# Patient Record
Sex: Female | Born: 1985 | Race: White | Hispanic: No | Marital: Married | Smoking: Never smoker
Health system: Southern US, Community
[De-identification: ages and names within clinical notes are randomized; demographics above are authoritative.]

## PROBLEM LIST (undated history)

## (undated) DIAGNOSIS — R87629 Unspecified abnormal cytological findings in specimens from vagina: Secondary | ICD-10-CM

## (undated) DIAGNOSIS — F32A Depression, unspecified: Secondary | ICD-10-CM

## (undated) DIAGNOSIS — F419 Anxiety disorder, unspecified: Secondary | ICD-10-CM

## (undated) DIAGNOSIS — E041 Nontoxic single thyroid nodule: Secondary | ICD-10-CM

## (undated) DIAGNOSIS — N289 Disorder of kidney and ureter, unspecified: Secondary | ICD-10-CM

## (undated) DIAGNOSIS — K219 Gastro-esophageal reflux disease without esophagitis: Secondary | ICD-10-CM

## (undated) DIAGNOSIS — Z8619 Personal history of other infectious and parasitic diseases: Secondary | ICD-10-CM

## (undated) DIAGNOSIS — Z9889 Other specified postprocedural states: Secondary | ICD-10-CM

## (undated) DIAGNOSIS — F329 Major depressive disorder, single episode, unspecified: Secondary | ICD-10-CM

## (undated) DIAGNOSIS — K589 Irritable bowel syndrome without diarrhea: Secondary | ICD-10-CM

## (undated) HISTORY — DX: Personal history of other infectious and parasitic diseases: Z86.19

## (undated) HISTORY — PX: WISDOM TOOTH EXTRACTION: SHX21

## (undated) HISTORY — DX: Gastro-esophageal reflux disease without esophagitis: K21.9

## (undated) HISTORY — DX: Nontoxic single thyroid nodule: E04.1

## (undated) HISTORY — DX: Unspecified abnormal cytological findings in specimens from vagina: R87.629

## (undated) HISTORY — DX: Disorder of kidney and ureter, unspecified: N28.9

## (undated) HISTORY — DX: Irritable bowel syndrome, unspecified: K58.9

---

## 2008-12-12 ENCOUNTER — Emergency Department (HOSPITAL_COMMUNITY): Admission: EM | Admit: 2008-12-12 | Discharge: 2008-12-12 | Payer: Self-pay | Admitting: Emergency Medicine

## 2009-07-02 ENCOUNTER — Encounter: Payer: Self-pay | Admitting: Gastroenterology

## 2009-07-06 ENCOUNTER — Emergency Department (HOSPITAL_BASED_OUTPATIENT_CLINIC_OR_DEPARTMENT_OTHER): Admission: EM | Admit: 2009-07-06 | Discharge: 2009-07-06 | Payer: Self-pay | Admitting: Emergency Medicine

## 2009-07-09 ENCOUNTER — Encounter (INDEPENDENT_AMBULATORY_CARE_PROVIDER_SITE_OTHER): Payer: Self-pay | Admitting: *Deleted

## 2009-07-31 ENCOUNTER — Ambulatory Visit: Payer: Self-pay | Admitting: Gastroenterology

## 2009-07-31 DIAGNOSIS — R109 Unspecified abdominal pain: Secondary | ICD-10-CM | POA: Insufficient documentation

## 2009-07-31 LAB — CONVERTED CEMR LAB
ALT: 13 units/L (ref 0–35)
AST: 17 units/L (ref 0–37)
BUN: 13 mg/dL (ref 6–23)
Basophils Absolute: 0 10*3/uL (ref 0.0–0.1)
Basophils Relative: 0.6 % (ref 0.0–3.0)
Creatinine, Ser: 0.7 mg/dL (ref 0.4–1.2)
Eosinophils Absolute: 0.1 10*3/uL (ref 0.0–0.7)
HCT: 35.7 % — ABNORMAL LOW (ref 36.0–46.0)
Hemoglobin: 12.2 g/dL (ref 12.0–15.0)
Lymphs Abs: 2 10*3/uL (ref 0.7–4.0)
MCHC: 34.2 g/dL (ref 30.0–36.0)
MCV: 92 fL (ref 78.0–100.0)
Monocytes Absolute: 0.4 10*3/uL (ref 0.1–1.0)
Neutro Abs: 5.1 10*3/uL (ref 1.4–7.7)
RDW: 11.3 % — ABNORMAL LOW (ref 11.5–14.6)
Tissue Transglutaminase Ab, IgA: 0.4 units (ref <7)
Total Bilirubin: 0.2 mg/dL — ABNORMAL LOW (ref 0.3–1.2)

## 2009-08-13 ENCOUNTER — Ambulatory Visit (HOSPITAL_COMMUNITY): Admission: RE | Admit: 2009-08-13 | Discharge: 2009-08-13 | Payer: Self-pay | Admitting: Gastroenterology

## 2009-08-19 ENCOUNTER — Encounter: Payer: Self-pay | Admitting: Gastroenterology

## 2009-09-01 HISTORY — PX: LAPAROSCOPIC CHOLECYSTECTOMY: SUR755

## 2009-09-07 ENCOUNTER — Ambulatory Visit (HOSPITAL_COMMUNITY): Admission: RE | Admit: 2009-09-07 | Discharge: 2009-09-07 | Payer: Self-pay | Admitting: General Surgery

## 2009-09-23 ENCOUNTER — Encounter: Payer: Self-pay | Admitting: Gastroenterology

## 2010-05-05 ENCOUNTER — Ambulatory Visit (HOSPITAL_COMMUNITY): Admission: RE | Admit: 2010-05-05 | Discharge: 2010-05-05 | Payer: Self-pay | Admitting: Family Medicine

## 2010-05-05 ENCOUNTER — Encounter (INDEPENDENT_AMBULATORY_CARE_PROVIDER_SITE_OTHER): Payer: Self-pay | Admitting: *Deleted

## 2010-06-25 ENCOUNTER — Ambulatory Visit: Payer: Self-pay | Admitting: Gastroenterology

## 2010-06-25 LAB — CONVERTED CEMR LAB: TSH: 1.01 microintl units/mL (ref 0.35–5.50)

## 2010-07-06 ENCOUNTER — Encounter: Payer: Self-pay | Admitting: Gastroenterology

## 2010-07-24 ENCOUNTER — Encounter: Payer: Self-pay | Admitting: Family Medicine

## 2010-08-03 NOTE — Assessment & Plan Note (Signed)
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  who has had stomach issues for her whole life.  Used to have have vomitting after eating especially with pastas, pizzas, heavy meals.    past 6 months, alot nauseas after eating.  burning in chest.    has had two episodes of RUQ pains, after eating heavy meals.  These lasted  about 2 hours, was miserable with very sharp pains.  Not sure if she was nauseas.  Had a gallbladder US a few  weeks ago, no gallstones were noted.    rare NSAIDs.  Does get [pyrosis on occasion, belching. Tums +/- improvement.           Current Medications (verified): 1)  Low-Ogestrel 0.3-30 Mg-Mcg Tabs (Norgestrel-Ethinyl Estradiol) .... 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  All other review of systems was otherwise negative.   Vital Signs:  Patient profile:   25 year old female Height:      65 inches Weight:      140 pounds BMI:     23.38 Pulse rate:   92 / minute Pulse rhythm:   regular BP sitting:   100 / 60  (left arm) Cuff size:   regular  Vitals Entered By: Francee Piccolo CMA Duncan Dull)  (July 31, 2009 2:34 PM)  Physical Exam  Additional Exam:  Constitutional: generally well appearing Psychiatric: alert and oriented times 3 Eyes: extraocular movements intact Mouth: oropharynx moist, no lesions Neck: supple, no lymphadenopathy Cardiovascular: heart regular rate and rythm Lungs: CTA bilaterally Abdomen: soft, non-tender, non-distended, no obvious ascites, no peritoneal signs, normal bowel sounds Extremities: no lower extremity edema bilaterally Skin: no lesions on visible extremities    Impression & Recommendations:  Problem # 1:  GERD-like symptoms, right upper quadrant pain she may have to issues going on. Her intermittent right upper quadrant pains are most similar to biliary disease. She has had an abdominal ultrasound and tells me that it was normal. We will get those results sent over. Perhaps she has biliary dyskinesia. We will arrange for a HIDA scan to be performed. If this is positive then I will likely set her up with general surgical referral. She also has GERD-like issues of belching, acid regurgitation. She is never really tried proton pump inhibitors and so we will give her samples of one today. She will take one pill daily before breakfast meal. We will also get labs sent celiac sprue testing, CBC, complete blood profile  Patient Instructions: 1)  You will get lab test(s) done today (tTG, IgA, cbc, cmet). 2)  We will get copies of your recent ultrasound (done at [facility]).  3)  HIDA scan of gallbladder function with CCK stimiluation. 4)  Samples of PPI, take one pill 20-30 min before breakfast meal. 5)  A copy of this information will be sent to Dr. Edmon Crape. 6)  The medication list was reviewed and reconciled.  All changed / newly prescribed medications were explained.  A complete medication list was provided to the patient / caregiver.  Appended Document: Orders Update    Clinical Lists Changes  Problems: Added new problem of  ABDOMINAL PAIN OTHER SPECIFIED SITE (ICD-789.09) Orders: Added new Test order of TLB-CBC Platelet - w/Differential (85025-CBCD) - Signed Added new Test order of TLB-CMP (Comprehensive Metabolic Pnl) (80053-COMP) - Signed Added new Test order of TLB-IgA (Immunoglobulin A) (82784-IGA) - Signed Added new Test order of T-Tissue Transglutamase Ab IgA ([id number]) - Signed Added new Referral order of HIDA CCK (HIDA CCK) - Signed      Appended Document:  recieved [place] report dated 07/02/2009: "normal abd ultrasound"

## 2010-08-03 NOTE — Letter (Signed)
Summary: New Patient letter  [facility]  [address] [place], [place] [address]   Phone: [phone]  Fax: [phone]       07/09/2009 MRN: [id number]  Misty Austin [address] [place], [place]  [address]  Dear Ms. [facility],  Welcome to the Gastroenterology Division at [facility].    You are scheduled to see Dr. Jarold Motto on 07-31-09 at 9:30a.m. on the 3rd floor at [facility], [address]  We ask that you try to arrive at our office 15 minutes prior to your appointment time to allow for check-in.  We would like you to complete the enclosed self-administered evaluation form prior to your visit and bring it with you on the day of your appointment.  We will review it with you.  Also, please bring a complete list of all your medications or, if you prefer, bring the medication bottles and we will list them.  Please bring your insurance card so that we may make a copy of it.  If your insurance requires a referral to see a specialist, please bring your referral form from your primary care physician.  Co-payments are due at the time of your visit and may be paid by cash, check or credit card.     Your office visit will consist of a consult with your physician (includes a physical exam), any laboratory testing he/she may order, scheduling of any necessary diagnostic testing (e.g. x-ray, ultrasound, CT-scan), and scheduling of a procedure (e.g. Endoscopy, Colonoscopy) if required.  Please allow enough time on your schedule to allow for any/all of these possibilities.    If you cannot keep your appointment, please call [phone] to cancel or reschedule prior to your appointment date.  This allows [place] the opportunity to schedule an appointment for another patient in need of care.  If you do not cancel or reschedule by 5 p.m. the business day prior to your appointment date, you will be charged a $50.00 late cancellation/no-show fee.    Thank you for  choosing [facility] for your medical needs.  We appreciate the opportunity to care for you.  Please visit [place] at our website  to learn more about our practice.                     Sincerely,                                                             The Gastroenterology Division

## 2010-08-03 NOTE — Consult Note (Signed)
Summary: [facility]   Imported By: Sherian Rein 09/09/2009 14:09:59  _____________________________________________________________________  External Attachment:    Type:   Image     Comment:   External Document

## 2010-08-03 NOTE — Letter (Signed)
Summary: [facility]   Imported By: Maryln Gottron 10/20/2009 15:33:35  _____________________________________________________________________  External Attachment:    Type:   Image     Comment:   External Document

## 2010-08-05 NOTE — Miscellaneous (Signed)
Summary: rx  Clinical Lists Changes  Medications: Rx of PRILOSEC OTC 20 MG TBEC (OMEPRAZOLE MAGNESIUM) 1 by mouth once daily;  #30 x 11;  Signed;  Entered by: Rachael Fee MD;  Authorized by: Rachael Fee MD;  Method used: Electronically to [facility]*, [address], [address] Shipping/mailing, [place], [place]  [address], Ph: [phone], Fax: [phone]    Prescriptions: PRILOSEC OTC 20 MG TBEC (OMEPRAZOLE MAGNESIUM) 1 by mouth once daily  #30 x 11   Entered and Authorized by:   Rachael Fee MD   Signed by:   Rachael Fee MD on 07/06/2010   Method used:   Electronically to        [facility]* (retail)       [address]       [address] Shipping/mailing       [place], [place]  [address]       Ph: [phone]       Fax: [phone]   RxID:   [id number]

## 2010-08-05 NOTE — Assessment & Plan Note (Signed)
  Review of gastrointestinal problems: 1. 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 She  had lap chole earlier this year, did well until about a month ago.  Bloating, twisting feeling that lasted for 3-4 days.  She has occasional mucous, no bleeding.  Overall stable weight.  Sometimes BM will help alleviate discomfort.  She tried one of her mom's antispasm med and it helps.  She had a CT and tells me this was completely normal. She has been fairly fatigued recently but has had a lot stress at work.           Current Medications (verified): 1)  Low-Ogestrel 0.3-30 Mg-Mcg Tabs (Norgestrel-Ethinyl Estradiol) .... Take 1 Tablet By Mouth Once A Day 2)  Prilosec Otc 20 Mg Tbec (Omeprazole Magnesium) .Marland Kitchen.. 1 By Mouth Once Daily  Allergies (verified): No Known Drug Allergies  Vital Signs:  Patient profile:   25 year old female Height:      65 inches Weight:      142 pounds BMI:     23.72 BSA:     1.71 Pulse rate:   80 / minute Pulse rhythm:   regular BP sitting:   100 / 70  (left arm)  Vitals Entered By: Merri Ray CMA Duncan Dull) (June 25, 2010 1:48 PM)  Physical Exam  Additional Exam:  Constitutional: generally well appearing Psychiatric: alert and oriented times 3 Abdomen: soft, non-tender, non-distended, normal bowel sounds    Impression & Recommendations:  Problem # 1:  IBS-like symptoms, fatigue we will check a thyroid level today. I recommended she try taking antispasm medicines which have called in for her. She'll take these on an as-needed basis and she knows to get in touch if things worsen or do not  improve.  Other Orders: TLB-TSH (Thyroid Stimulating Hormone) [phone])  Patient Instructions: 1)  Dr. Christella Hartigan will review the CT scan. 2)  For now, trial of sublingual antispasm meds. 3)  Call if symptoms worsen. 4)  You will get lab test(s) done today (tsh). 5)  The medication list was reviewed and reconciled.  All changed / newly prescribed medications were explained.  A complete medication list was provided to the patient / caregiver. Prescriptions: HYOSCYAMINE SULFATE 0.125 MG SUBL (HYOSCYAMINE SULFATE) take one to two pills as needed, every 5-6 hours  #50 x 3   Entered and Authorized by:   Rachael Fee MD   Signed by:   Rachael Fee MD on 06/25/2010   Method used:   Electronically to        [facility]* (retail)       [address]       [address] Shipping/mailing       [place], [place]  [address]       Ph: [phone]       Fax: [phone]   RxID:   [id number]

## 2010-09-19 LAB — COMPREHENSIVE METABOLIC PANEL
ALT: 19 U/L (ref 0–35)
AST: 16 U/L (ref 0–37)
Albumin: 4.2 g/dL (ref 3.5–5.2)
Alkaline Phosphatase: 53 U/L (ref 39–117)
Calcium: 9.3 mg/dL (ref 8.4–10.5)
GFR calc Af Amer: >60 mL/min (ref >60)
Potassium: 3.6 mEq/L (ref 3.5–5.1)
Sodium: 142 mEq/L (ref 135–145)
Total Protein: 7 g/dL (ref 6.0–8.3)

## 2010-09-19 LAB — URINALYSIS, ROUTINE W REFLEX MICROSCOPIC
Bilirubin Urine: NEGATIVE
Nitrite: NEGATIVE
Protein, ur: 30 mg/dL — AB
Specific Gravity, Urine: 1.025 (ref 1.005–1.030)
Urobilinogen, UA: 0.2 mg/dL (ref 0.0–1.0)

## 2010-09-19 LAB — CBC
Hemoglobin: 12.3 g/dL (ref 12.0–15.0)
MCHC: 34.8 g/dL (ref 30.0–36.0)
Platelets: 259 10*3/uL (ref 150–400)
RDW: 11.8 % (ref 11.5–15.5)

## 2010-09-19 LAB — URINE MICROSCOPIC-ADD ON

## 2010-09-19 LAB — DIFFERENTIAL
Basophils Relative: 1 % (ref 0–1)
Eosinophils Absolute: 0.1 10*3/uL (ref 0.0–0.7)
Eosinophils Relative: 2 % (ref 0–5)
Lymphs Abs: 2.1 10*3/uL (ref 0.7–4.0)
Monocytes Absolute: 0.4 10*3/uL (ref 0.1–1.0)
Monocytes Relative: 8 % (ref 3–12)
Neutrophils Relative %: 50 % (ref 43–77)

## 2010-09-19 LAB — PREGNANCY, URINE: Preg Test, Ur: NEGATIVE

## 2010-09-19 LAB — URINE CULTURE: Colony Count: 45000

## 2010-09-27 LAB — PREGNANCY, URINE: Preg Test, Ur: NEGATIVE

## 2011-11-30 ENCOUNTER — Ambulatory Visit (INDEPENDENT_AMBULATORY_CARE_PROVIDER_SITE_OTHER): Payer: 59 | Admitting: Gastroenterology

## 2011-11-30 ENCOUNTER — Encounter: Payer: Self-pay | Admitting: Gastroenterology

## 2011-11-30 DIAGNOSIS — R143 Flatulence: Secondary | ICD-10-CM

## 2011-11-30 DIAGNOSIS — R141 Gas pain: Secondary | ICD-10-CM

## 2011-11-30 DIAGNOSIS — R109 Unspecified abdominal pain: Secondary | ICD-10-CM

## 2011-11-30 DIAGNOSIS — R140 Abdominal distension (gaseous): Secondary | ICD-10-CM

## 2011-11-30 MED ORDER — HYOSCYAMINE SULFATE ER 0.375 MG PO TB12: 375.0000 ug | ORAL_TABLET | Freq: Two times a day (BID) | ORAL | Status: DC | PRN

## 2011-11-30 NOTE — Progress Notes (Signed)
Review of gastrointestinal problems:  1. 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  Has been taking antispasm medicines for intermittent bloating, abdominal discomfort and cramping. These seem to help and she was interested in a refill.  Sometimes when she eats spicey foods, very bloated and sharp shooting pains.  A bad occurrence last month.  The night prior spicier than usual buffalo wing.  She does not take ppi regularly.     Past Medical History  Diagnosis Date  . IBS (irritable bowel syndrome)   . Congenital kidney disease   . GERD (gastroesophageal reflux disease)     Past Surgical History  Procedure Date  . Laparoscopic cholecystectomy     Current Outpatient Prescriptions  Medication Sig Dispense Refill  . norgestrel-ethinyl estradiol (LO/OVRAL,CRYSELLE) 0.3-30 MG-MCG tablet Take 1 tablet by mouth daily.        Allergies as of 11/30/2011  . (No Known Allergies)    Family History  Problem Relation Age of Onset  . Esophageal cancer Maternal Grandfather   . Diabetes Maternal Grandmother   . Kidney disease Mother   . Diabetes Paternal Grandmother   . Breast cancer Maternal Grandmother   . Lymphoma Paternal Grandfather   . Leukemia Paternal Grandfather     History   Social History  . Marital Status: Single    Spouse Name: N/A    Number of Children: 0  . Years of Education: N/A   Occupational History  . nurse    Social History Main Topics  . Smoking status: Never Smoker   . Smokeless tobacco: Never Used  . Alcohol Use: No  . Drug Use: No  . Sexually Active: Not on file   Other Topics Concern  . Not on file   Social History Narrative  . No narrative on file      Physical Exam: Wt 137 lb 8 oz (62.37 kg) Constitutional: generally well-appearing Psychiatric: alert and oriented x3 Abdomen: soft, nontender,  nondistended, no obvious ascites, no peritoneal signs, normal bowel sounds     Assessment and plan: 26 y.o. female with intermittent abdominal discomforts, diet related  She is going to try a single over-the-counter H2 blocker when she eats a meal that is particularly spicy. She will give this 4-6 weeks if it does not help then she will try Gas-X pill with those same meals. She will call to report on her symptoms in 2 months and sooner if needed. I have given her a refill prescription for antispasmodics.

## 2011-11-30 NOTE — Patient Instructions (Signed)
New prescriptions for antispasm medicine.  Take as usual. Try OTC pepcid or zantac (generic equivalent). Take one pill at mealtime of spicey meal. Given this 4-6 weeks, if no improvement then try one gas ex pill at the time of those meals. Call to report on your symptoms in 2 months, sooner if things are getting worse.

## 2012-07-04 NOTE — L&D Delivery Note (Signed)
Delivery Note Pt progressed rapidly to complete dilation. At 9:38 PM a healthy female was delivered via Vaginal, Spontaneous Delivery (Presentation: Left Occiput Transverse).  APGAR: 8, 9; weight 6#10oz .   Placenta status: Partial manual extraction, was adherent at top of fundus.  Fundal exploration after delivery showed no retained fragments.  Cord: 3 vessels with the following complications: Long and nuchal x 1 reduced. Anesthesia: Epidural  Episiotomy: None Lacerations: 1st degree Suture Repair: 3.0 vicryl Est. Blood Loss (mL): 350cc  Mom to postpartum.  Baby to stay with mom.Marland Kitchen  Oliver Pila 01/29/2013, 10:03 PM

## 2012-08-04 DIAGNOSIS — Z9889 Other specified postprocedural states: Secondary | ICD-10-CM

## 2012-08-04 HISTORY — DX: Other specified postprocedural states: Z98.890

## 2012-09-24 LAB — OB RESULTS CONSOLE ABO/RH: RH Type: POSITIVE

## 2012-11-08 LAB — OB RESULTS CONSOLE HIV ANTIBODY (ROUTINE TESTING): HIV: NONREACTIVE

## 2013-01-10 LAB — OB RESULTS CONSOLE GBS: GBS: NEGATIVE

## 2013-01-29 ENCOUNTER — Inpatient Hospital Stay (HOSPITAL_COMMUNITY)
Admission: AD | Admit: 2013-01-29 | Discharge: 2013-01-31 | DRG: 774 | Disposition: A | Discharge status: Home / Self Care | Payer: 59 | Source: Ambulatory Visit | Attending: Obstetrics and Gynecology | Admitting: Obstetrics and Gynecology

## 2013-01-29 ENCOUNTER — Encounter (HOSPITAL_COMMUNITY): Payer: Self-pay | Admitting: Anesthesiology

## 2013-01-29 ENCOUNTER — Inpatient Hospital Stay (HOSPITAL_COMMUNITY): Payer: 59 | Admitting: Anesthesiology

## 2013-01-29 ENCOUNTER — Encounter (HOSPITAL_COMMUNITY): Payer: Self-pay

## 2013-01-29 DIAGNOSIS — N059 Unspecified nephritic syndrome with unspecified morphologic changes: Secondary | ICD-10-CM | POA: Diagnosis present

## 2013-01-29 DIAGNOSIS — IMO0002 Reserved for concepts with insufficient information to code with codable children: Secondary | ICD-10-CM | POA: Diagnosis present

## 2013-01-29 DIAGNOSIS — O26839 Pregnancy related renal disease, unspecified trimester: Secondary | ICD-10-CM | POA: Diagnosis present

## 2013-01-29 DIAGNOSIS — N871 Moderate cervical dysplasia: Secondary | ICD-10-CM | POA: Diagnosis present

## 2013-01-29 HISTORY — DX: Other specified postprocedural states: Z98.890

## 2013-01-29 LAB — OB RESULTS CONSOLE HEPATITIS B SURFACE ANTIGEN: Hepatitis B Surface Ag: NEGATIVE

## 2013-01-29 LAB — OB RESULTS CONSOLE RUBELLA ANTIBODY, IGM: Rubella: IMMUNE

## 2013-01-29 LAB — CBC
HCT: 35.6 % — ABNORMAL LOW (ref 36.0–46.0)
Hemoglobin: 12.1 g/dL (ref 12.0–15.0)
WBC: 8.9 10*3/uL (ref 4.0–10.5)

## 2013-01-29 LAB — OB RESULTS CONSOLE RPR: RPR: NONREACTIVE

## 2013-01-29 LAB — OB RESULTS CONSOLE GC/CHLAMYDIA
Chlamydia: NEGATIVE
Gonorrhea: NEGATIVE

## 2013-01-29 MED ORDER — TERBUTALINE SULFATE 1 MG/ML IJ SOLN: 0.2500 mg | Freq: Once | INTRAMUSCULAR | Status: AC | PRN

## 2013-01-29 MED ORDER — OXYTOCIN BOLUS FROM INFUSION: 500.0000 mL | INTRAVENOUS | Status: DC

## 2013-01-29 MED ORDER — LACTATED RINGERS IV SOLN
INTRAVENOUS | Status: DC
  Administered 2013-01-29: 14:00:00 via INTRAVENOUS

## 2013-01-29 MED ORDER — EPHEDRINE 5 MG/ML INJ
10.0000 mg | INTRAVENOUS | Status: DC | PRN
  Filled 2013-01-29: qty 2
  Filled 2013-01-29: qty 4

## 2013-01-29 MED ORDER — LIDOCAINE HCL (PF) 1 % IJ SOLN
30.0000 mL | INTRAMUSCULAR | Status: DC | PRN
  Filled 2013-01-29 (×2): qty 30

## 2013-01-29 MED ORDER — LACTATED RINGERS IV SOLN
500.0000 mL | Freq: Once | INTRAVENOUS | Status: AC
  Administered 2013-01-29: 500 mL via INTRAVENOUS

## 2013-01-29 MED ORDER — EPHEDRINE 5 MG/ML INJ
10.0000 mg | INTRAVENOUS | Status: DC | PRN
  Filled 2013-01-29: qty 2

## 2013-01-29 MED ORDER — OXYTOCIN 40 UNITS IN LACTATED RINGERS INFUSION - SIMPLE MED: 62.5000 mL/h | INTRAVENOUS | Status: DC

## 2013-01-29 MED ORDER — BUTORPHANOL TARTRATE 1 MG/ML IJ SOLN: 1.0000 mg | INTRAMUSCULAR | Status: DC | PRN

## 2013-01-29 MED ORDER — OXYTOCIN 40 UNITS IN LACTATED RINGERS INFUSION - SIMPLE MED
INTRAVENOUS | Status: AC
  Filled 2013-01-29: qty 1000

## 2013-01-29 MED ORDER — DIPHENHYDRAMINE HCL 50 MG/ML IJ SOLN: 12.5000 mg | INTRAMUSCULAR | Status: DC | PRN

## 2013-01-29 MED ORDER — ACETAMINOPHEN 325 MG PO TABS: 650.0000 mg | ORAL_TABLET | ORAL | Status: DC | PRN

## 2013-01-29 MED ORDER — LACTATED RINGERS IV SOLN: 500.0000 mL | INTRAVENOUS | Status: DC | PRN

## 2013-01-29 MED ORDER — LIDOCAINE HCL (PF) 1 % IJ SOLN
INTRAMUSCULAR | Status: DC | PRN
  Administered 2013-01-29 (×2): 5 mL

## 2013-01-29 MED ORDER — CITRIC ACID-SODIUM CITRATE 334-500 MG/5ML PO SOLN: 30.0000 mL | ORAL | Status: DC | PRN

## 2013-01-29 MED ORDER — PHENYLEPHRINE 40 MCG/ML (10ML) SYRINGE FOR IV PUSH (FOR BLOOD PRESSURE SUPPORT)
80.0000 ug | PREFILLED_SYRINGE | INTRAVENOUS | Status: DC | PRN
  Filled 2013-01-29: qty 5
  Filled 2013-01-29: qty 2

## 2013-01-29 MED ORDER — OXYCODONE-ACETAMINOPHEN 5-325 MG PO TABS: 1.0000 | ORAL_TABLET | ORAL | Status: DC | PRN

## 2013-01-29 MED ORDER — FLEET ENEMA 7-19 GM/118ML RE ENEM: 1.0000 | ENEMA | Freq: Every day | RECTAL | Status: DC | PRN

## 2013-01-29 MED ORDER — FENTANYL 2.5 MCG/ML BUPIVACAINE 1/10 % EPIDURAL INFUSION (WH - ANES)
14.0000 mL/h | INTRAMUSCULAR | Status: DC | PRN
  Administered 2013-01-29: 14 mL/h via EPIDURAL
  Filled 2013-01-29: qty 125

## 2013-01-29 MED ORDER — IBUPROFEN 600 MG PO TABS: 600.0000 mg | ORAL_TABLET | Freq: Four times a day (QID) | ORAL | Status: DC | PRN

## 2013-01-29 MED ORDER — PHENYLEPHRINE 40 MCG/ML (10ML) SYRINGE FOR IV PUSH (FOR BLOOD PRESSURE SUPPORT)
80.0000 ug | PREFILLED_SYRINGE | INTRAVENOUS | Status: DC | PRN
  Filled 2013-01-29: qty 2

## 2013-01-29 MED ORDER — OXYTOCIN 40 UNITS IN LACTATED RINGERS INFUSION - SIMPLE MED
1.0000 m[IU]/min | INTRAVENOUS | Status: DC
  Administered 2013-01-29: 2 m[IU]/min via INTRAVENOUS

## 2013-01-29 MED ORDER — ONDANSETRON HCL 4 MG/2ML IJ SOLN: 4.0000 mg | Freq: Four times a day (QID) | INTRAMUSCULAR | Status: DC | PRN

## 2013-01-29 NOTE — MAU Note (Signed)
Patient will be evaluated for ROM in birthing suites 164. Triage report given to Earle Gell., CN

## 2013-01-29 NOTE — H&P (Signed)
Misty Austin is a 27 y.o. female G1P0 at 51 weeks (EDD 02/12/13 by LMP c/w 6 week [place]) presenting for SROM probably last p, but increased LOF this AM.  No significant contractions.  Pt was nitrazine positive in office, but fern negative.  A confirmatory amniosure was done at the hospital.  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  Her baseline 24 hour urine had 200mg  of protein, but she has not had any other issues with this throughout the pregnancy and the baby is female.  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  She has been followed by colposcopy and will have further assessment postpartum as all has stayed stable.  Maternal Medical History:  Reason for admission: Rupture of membranes.   Contractions: Onset was 6-12 hours ago.   Frequency: irregular.   Perceived severity is mild.    Fetal activity: Perceived fetal activity is normal.    Prenatal Complications - Diabetes: none.    OB History   Grav Para Term Preterm Abortions TAB SAB Ect Mult Living   1              Past Medical History  Diagnosis Date  . IBS (irritable bowel syndrome)   . Congenital kidney disease   . GERD (gastroesophageal reflux disease)    Past Surgical History  Procedure Laterality Date  . Laparoscopic cholecystectomy     Family History: family history includes Breast cancer in her maternal grandmother; Diabetes in her maternal grandmother and paternal grandmother; Esophageal cancer in her maternal grandfather; Kidney disease in her mother; Leukemia in her paternal grandfather; and Lymphoma in her paternal grandfather. Social History:  reports that she has never smoked. She has never used smokeless tobacco. She reports that she does not drink alcohol or use illicit drugs.   Prenatal Transfer Tool  Maternal Diabetes: No Genetic Screening: Normal Maternal  Ultrasounds/Referrals: Normal Fetal Ultrasounds or other Referrals:  None Maternal Substance Abuse:  No Significant Maternal Medications:  None Significant Maternal Lab Results:  None Other Comments:  None  ROS    Blood pressure 121/79, pulse 87, resp. rate 16, height 5' 4.5" (1.638 m), weight 78.654 kg (173 lb 6.4 oz), SpO2 100.00%. Maternal Exam:  Uterine Assessment: Contraction strength is mild.  Contraction frequency is irregular.   Abdomen: Patient reports no abdominal tenderness. Fetal presentation: vertex  Introitus: Normal vulva. Normal vagina.  Ferning test: negative.  Nitrazine test: positive. Amniotic fluid character: clear.  Pelvis: adequate for delivery.      Physical Exam  Constitutional: She is oriented to person, place, and time. She appears well-developed and well-nourished.  Cardiovascular: Normal rate and regular rhythm.   Respiratory: Effort normal and breath sounds normal.  GI: Soft.  Genitourinary: Vagina normal.  Uterus gravid  Neurological: She is alert and oriented to person, place, and time.  Psychiatric: She has a normal mood and affect. Her behavior is normal.    Prenatal labs: ABO, Rh:  O positive Antibody:  negative Rubella:   immune RPR:   NR HBsAg:   Neg HIV:   NR GBS:   Neg One hour GTT 104 First trimester screen and AFP WNL  Assessment/Plan: Pt is admitted for SROM and augmentation of labor.  Will start pitocin and follow progress.   Oliver Pila 01/29/2013, 12:54 PM

## 2013-01-29 NOTE — MAU Note (Signed)
Patient states she started leaking clear fluid since 1800 7-28, got heavier this am around 0400. Had a positive Nirtazine in the office with a negative fern. Sent to MAU for further evaluation. Patient states she is having a mild contractions. Reports good fetal movement.

## 2013-01-29 NOTE — Anesthesia Procedure Notes (Signed)
Epidural Patient location during procedure: OB Start time: 01/29/2013 7:43 PM  Staffing Anesthesiologist: Angus Seller., Harrell Gave. Performed by: anesthesiologist   Preanesthetic Checklist Completed: patient identified, site marked, surgical consent, pre-op evaluation, timeout performed, IV checked, risks and benefits discussed and monitors and equipment checked  Epidural Patient position: sitting Prep: site prepped and draped and DuraPrep Patient monitoring: continuous pulse ox and blood pressure Approach: midline Injection technique: LOR air and LOR saline  Needle:  Needle type: Tuohy  Needle gauge: 17 G Needle length: 9 cm and 9 Needle insertion depth: 5 cm cm Catheter type: closed end flexible Catheter size: 19 Gauge Catheter at skin depth: 10 cm Test dose: negative  Assessment Events: blood not aspirated, injection not painful, no injection resistance, negative IV test and no paresthesia  Additional Notes Patient identified.  Risk benefits discussed including failed block, incomplete pain control, headache, nerve damage, paralysis, blood pressure changes, nausea, vomiting, reactions to medication both toxic or allergic, and postpartum back pain.  Patient expressed understanding and wished to proceed.  All questions were answered.  Sterile technique used throughout procedure and epidural site dressed with sterile barrier dressing. No paresthesia or other complications noted.The patient did not experience any signs of intravascular injection such as tinnitus or metallic taste in mouth nor signs of intrathecal spread such as rapid motor block. Please see nursing notes for vital signs.

## 2013-01-29 NOTE — Progress Notes (Signed)
Patient ID: Misty Austin, female   DOB: 1986-02-02, 27 y.o.   MRN: [id number] Pt on pitocin and just starting to get uncomfortable FHR category 1 Cervix 70/3/-2 No forebag felt Pitocin just increased to 12 mu Continue to follow progress  Epidural when needed

## 2013-01-29 NOTE — Anesthesia Preprocedure Evaluation (Signed)
Anesthesia Evaluation  Patient identified by MRN, date of birth, ID band Patient awake    Reviewed: Allergy & Precautions, H&P , Patient's Chart, lab work & pertinent test results  Airway Mallampati: II TM Distance: >3 FB Neck ROM: full    Dental no notable dental hx.    Pulmonary neg pulmonary ROS,  breath sounds clear to auscultation  Pulmonary exam normal       Cardiovascular negative cardio ROS  Rhythm:regular Rate:Normal     Neuro/Psych negative neurological ROS  negative psych ROS   GI/Hepatic negative GI ROS, Neg liver ROS, GERD-  ,  Endo/Other  negative endocrine ROS  Renal/GU Renal diseasenegative Renal ROS     Musculoskeletal   Abdominal   Peds  Hematology negative hematology ROS (+)   Anesthesia Other Findings IBS (irritable bowel syndrome)     Congenital kidney disease        GERD (gastroesophageal reflux disease)     History of colposcopy with cervical biopsy    Reproductive/Obstetrics (+) Pregnancy                           Anesthesia Physical Anesthesia Plan  ASA: II  Anesthesia Plan: Epidural   Post-op Pain Management:    Induction:   Airway Management Planned:   Additional Equipment:   Intra-op Plan:   Post-operative Plan:   Informed Consent: I have reviewed the patients History and Physical, chart, labs and discussed the procedure including the risks, benefits and alternatives for the proposed anesthesia with the patient or authorized representative who has indicated his/her understanding and acceptance.     Plan Discussed with:   Anesthesia Plan Comments:         Anesthesia Quick Evaluation

## 2013-01-30 LAB — CBC
HCT: 28.2 % — ABNORMAL LOW (ref 36.0–46.0)
MCHC: 34.4 g/dL (ref 30.0–36.0)
Platelets: 166 10*3/uL (ref 150–400)
RDW: 12.5 % (ref 11.5–15.5)

## 2013-01-30 MED ORDER — WITCH HAZEL-GLYCERIN EX PADS
1.0000 "application " | MEDICATED_PAD | CUTANEOUS | Status: DC | PRN
  Administered 2013-01-30: 1 via TOPICAL

## 2013-01-30 MED ORDER — OXYCODONE-ACETAMINOPHEN 5-325 MG PO TABS: 1.0000 | ORAL_TABLET | ORAL | Status: DC | PRN

## 2013-01-30 MED ORDER — SIMETHICONE 80 MG PO CHEW: 80.0000 mg | CHEWABLE_TABLET | ORAL | Status: DC | PRN

## 2013-01-30 MED ORDER — IBUPROFEN 600 MG PO TABS
600.0000 mg | ORAL_TABLET | Freq: Four times a day (QID) | ORAL | Status: DC
  Administered 2013-01-30 – 2013-01-31 (×7): 600 mg via ORAL
  Filled 2013-01-30 (×7): qty 1

## 2013-01-30 MED ORDER — ONDANSETRON HCL 4 MG/2ML IJ SOLN: 4.0000 mg | INTRAMUSCULAR | Status: DC | PRN

## 2013-01-30 MED ORDER — DIPHENHYDRAMINE HCL 25 MG PO CAPS: 25.0000 mg | ORAL_CAPSULE | Freq: Four times a day (QID) | ORAL | Status: DC | PRN

## 2013-01-30 MED ORDER — ZOLPIDEM TARTRATE 5 MG PO TABS: 5.0000 mg | ORAL_TABLET | Freq: Every evening | ORAL | Status: DC | PRN

## 2013-01-30 MED ORDER — TETANUS-DIPHTH-ACELL PERTUSSIS 5-2.5-18.5 LF-MCG/0.5 IM SUSP: 0.5000 mL | Freq: Once | INTRAMUSCULAR | Status: DC

## 2013-01-30 MED ORDER — DIBUCAINE 1 % RE OINT: 1.0000 "application " | TOPICAL_OINTMENT | RECTAL | Status: DC | PRN

## 2013-01-30 MED ORDER — SENNOSIDES-DOCUSATE SODIUM 8.6-50 MG PO TABS
2.0000 | ORAL_TABLET | Freq: Every day | ORAL | Status: DC
  Administered 2013-01-30: 2 via ORAL

## 2013-01-30 MED ORDER — LANOLIN HYDROUS EX OINT: TOPICAL_OINTMENT | CUTANEOUS | Status: DC | PRN

## 2013-01-30 MED ORDER — ONDANSETRON HCL 4 MG PO TABS: 4.0000 mg | ORAL_TABLET | ORAL | Status: DC | PRN

## 2013-01-30 MED ORDER — PRENATAL MULTIVITAMIN CH
1.0000 | ORAL_TABLET | Freq: Every day | ORAL | Status: DC
  Administered 2013-01-30 – 2013-01-31 (×2): 1 via ORAL
  Filled 2013-01-30: qty 1

## 2013-01-30 MED ORDER — BENZOCAINE-MENTHOL 20-0.5 % EX AERO
1.0000 "application " | INHALATION_SPRAY | CUTANEOUS | Status: DC | PRN
  Administered 2013-01-30: 1 via TOPICAL
  Filled 2013-01-30: qty 56

## 2013-01-30 NOTE — Progress Notes (Signed)
Post Partum Day 1 Subjective: no complaints, up ad lib, voiding, tolerating PO and nl lochia, pain controlled  Objective: Blood pressure 115/72, pulse 63, temperature 98 F (36.7 C), temperature source Oral, resp. rate 18, height 5\' 5"  (1.651 m), weight 78.472 kg (173 lb), SpO2 97.00%, unknown if currently breastfeeding.  Physical Exam:  General: alert and no distress Lochia: appropriate Uterine Fundus: firm   Recent Labs  01/29/13 1355 01/30/13 0620  HGB 12.1 9.7*  HCT 35.6* 28.2*    Assessment/Plan: Plan for discharge tomorrow.  Doing well.  Routine pp care.     LOS: 1 day   BOVARD,Advaith Lamarque 01/30/2013, 8:28 AM

## 2013-01-30 NOTE — Anesthesia Postprocedure Evaluation (Signed)
  Anesthesia Post-op Note  Anesthesia Post Note  Patient: Misty Austin  Procedure(s) Performed: * No procedures listed *  Anesthesia type: Epidural  Patient location: Mother/Baby  Post pain: Pain level controlled  Post assessment: Post-op Vital signs reviewed  Last Vitals:  Filed Vitals:   01/30/13 0516  BP: 115/72  Pulse: 63  Temp: 36.7 C  Resp: 18    Post vital signs: Reviewed  Level of consciousness:alert  Complications: No apparent anesthesia complications

## 2013-01-31 MED ORDER — IBUPROFEN 800 MG PO TABS: 800.0000 mg | ORAL_TABLET | Freq: Three times a day (TID) | ORAL | Status: DC | PRN

## 2013-01-31 MED ORDER — OXYCODONE-ACETAMINOPHEN 5-325 MG PO TABS: 1.0000 | ORAL_TABLET | Freq: Four times a day (QID) | ORAL | Status: DC | PRN

## 2013-01-31 MED ORDER — PRENATAL MULTIVITAMIN CH: 1.0000 | ORAL_TABLET | Freq: Every day | ORAL | Status: DC

## 2013-01-31 NOTE — Discharge Summary (Signed)
Obstetric Discharge Summary Reason for Admission: induction of labor Prenatal Procedures: none Intrapartum Procedures: spontaneous vaginal delivery Postpartum Procedures: none Complications-Operative and Postpartum: 1st  degree perineal laceration Hemoglobin  Date Value Range Status  01/30/2013 9.7* 12.0 - 15.0 g/dL Final     REPEATED TO VERIFY     DELTA CHECK NOTED     HCT  Date Value Range Status  01/30/2013 28.2* 36.0 - 46.0 % Final    Physical Exam:  General: alert and no distress Lochia: appropriate Uterine Fundus: firm  Discharge Diagnoses: Term Pregnancy-delivered  Discharge Information: Date: 01/31/2013 Activity: pelvic rest Diet: routine Medications: PNV, Ibuprofen and Percocet Condition: stable Instructions: refer to practice specific booklet Discharge to: home Follow-up Information   Follow up with Oliver Pila, MD. Schedule an appointment as soon as possible for a visit in 6 weeks. (postpartum)    Contact information:   [address], [address] [place] [address] [phone]       Newborn Data: Live born female  Birth Weight: 6 lb 10.5 oz (3020 g) APGAR: 8, 9  Home with mother.  BOVARD,Harshal Sirmon 01/31/2013, 8:52 AM

## 2013-01-31 NOTE — Progress Notes (Signed)
Post Partum Day 2 Subjective: no complaints, up ad lib, voiding, tolerating PO and nl lochia, pain controlled  Objective: Blood pressure 100/63, pulse 77, temperature 98.3 F (36.8 C), temperature source Oral, resp. rate 16, height 5\' 5"  (1.651 m), weight 78.472 kg (173 lb), SpO2 99.00%, unknown if currently breastfeeding.  Physical Exam:  General: alert and no distress Lochia: appropriate Uterine Fundus: firm   Recent Labs  01/29/13 1355 01/30/13 0620  HGB 12.1 9.7*  HCT 35.6* 28.2*    Assessment/Plan: Discharge home, Breastfeeding and Lactation consult.  D/C with motrin/percocet/ pnv.  F/u 6 weeks.    LOS: 2 days   BOVARD,Naszir Cott 01/31/2013, 8:29 AM

## 2013-02-05 ENCOUNTER — Ambulatory Visit (HOSPITAL_COMMUNITY): Payer: 59

## 2013-02-05 NOTE — Progress Notes (Signed)
Post discharge chart review completed.  

## 2013-03-04 LAB — HM PAP SMEAR

## 2013-09-04 ENCOUNTER — Other Ambulatory Visit: Payer: Self-pay | Admitting: Otolaryngology

## 2013-09-04 DIAGNOSIS — E041 Nontoxic single thyroid nodule: Secondary | ICD-10-CM

## 2013-09-10 ENCOUNTER — Inpatient Hospital Stay
Admission: RE | Admit: 2013-09-10 | Discharge: 2013-09-10 | Disposition: A | Discharge status: Home / Self Care | Payer: 59 | Source: Ambulatory Visit | Attending: Otolaryngology | Admitting: Otolaryngology

## 2013-09-10 ENCOUNTER — Other Ambulatory Visit (HOSPITAL_COMMUNITY): Payer: Self-pay | Admitting: Otolaryngology

## 2013-09-10 DIAGNOSIS — E041 Nontoxic single thyroid nodule: Secondary | ICD-10-CM

## 2013-09-13 ENCOUNTER — Ambulatory Visit (HOSPITAL_COMMUNITY)
Admission: RE | Admit: 2013-09-13 | Discharge: 2013-09-13 | Disposition: A | Discharge status: Home / Self Care | Payer: 59 | Source: Ambulatory Visit | Attending: Otolaryngology | Admitting: Otolaryngology

## 2013-09-13 DIAGNOSIS — E041 Nontoxic single thyroid nodule: Secondary | ICD-10-CM | POA: Insufficient documentation

## 2013-09-13 NOTE — Procedures (Signed)
Rt thyroid nodule  8 mm  US guided thyroid biopsy 4 passes  25 g needle  Pt tolerated well

## 2014-03-17 ENCOUNTER — Encounter: Payer: Self-pay | Admitting: Internal Medicine

## 2014-03-17 ENCOUNTER — Ambulatory Visit (INDEPENDENT_AMBULATORY_CARE_PROVIDER_SITE_OTHER): Payer: 59 | Admitting: Internal Medicine

## 2014-03-17 VITALS — BP 102/62 | HR 70 | Temp 97.8°F | Ht 65.0 in | Wt 139.5 lb

## 2014-03-17 DIAGNOSIS — Z0000 Encounter for general adult medical examination without abnormal findings: Secondary | ICD-10-CM

## 2014-03-17 NOTE — Progress Notes (Signed)
Pre visit review using our clinic review tool, if applicable. No additional management support is needed unless otherwise documented below in the visit note. 

## 2014-03-17 NOTE — Patient Instructions (Addendum)
It was good to see you today.  We have reviewed your prior records including labs and tests today  Health Maintenance reviewed - all recommended immunizations and age-appropriate screenings are up-to-date.  Test(s) ordered today -return when you are fasting. Your results will be released to [place] (or called to you) after review, usually within 72hours after test completion. If any changes need to be made, you will be notified at that same time.  Medications reviewed and updated, no changes recommended at this time.  Please schedule followup in 12 months for annual exam and labs, call sooner if problems.  Health Maintenance Adopting a healthy lifestyle and getting preventive care can go a long way to promote health and wellness. Talk with your health care provider about what schedule of regular examinations is right for you. This is a good chance for you to check in with your provider about disease prevention and staying healthy. In between checkups, there are plenty of things you can do on your own. Experts have done a lot of research about which lifestyle changes and preventive measures are most likely to keep you healthy. Ask your health care provider for more information. WEIGHT AND DIET  Eat a healthy diet  Be sure to include plenty of vegetables, fruits, low-fat dairy products, and lean protein.  Do not eat a lot of foods high in solid fats, added sugars, or salt.  Get regular exercise. This is one of the most important things you can do for your health.  Most adults should exercise for at least 150 minutes each week. The exercise should increase your heart rate and make you sweat (moderate-intensity exercise).  Most adults should also do strengthening exercises at least twice a week. This is in addition to the moderate-intensity exercise.  Maintain a healthy weight  Body mass index (BMI) is a measurement that can be used to identify possible weight problems. It estimates body  fat based on height and weight. Your health care provider can help determine your BMI and help you achieve or maintain a healthy weight.  For females 5 years of age and older:   A BMI below 18.5 is considered underweight.  A BMI of 18.5 to 24.9 is normal.  A BMI of 25 to 29.9 is considered overweight.  A BMI of 30 and above is considered obese.  Watch levels of cholesterol and blood lipids  You should start having your blood tested for lipids and cholesterol at 28 years of age, then have this test every 5 years.  You may need to have your cholesterol levels checked more often if:  Your lipid or cholesterol levels are high.  You are older than 28 years of age.  You are at high risk for heart disease.  CANCER SCREENING   Lung Cancer  Lung cancer screening is recommended for adults 53-63 years old who are at high risk for lung cancer because of a history of smoking.  A yearly low-dose CT scan of the lungs is recommended for people who:  Currently smoke.  Have quit within the past 15 years.  Have at least a 30-pack-year history of smoking. A pack year is smoking an average of one pack of cigarettes a day for 1 year.  Yearly screening should continue until it has been 15 years since you quit.  Yearly screening should stop if you develop a health problem that would prevent you from having lung cancer treatment.  Breast Cancer  Practice breast self-awareness. This means understanding how  your breasts normally appear and feel.  It also means doing regular breast self-exams. Let your health care provider know about any changes, no matter how small.  If you are in your 20s or 30s, you should have a clinical breast exam (CBE) by a health care provider every 1-3 years as part of a regular health exam.  If you are 40 or older, have a CBE every year. Also consider having a breast X-ray (mammogram) every year.  If you have a family history of breast cancer, talk to your health  care provider about genetic screening.  If you are at high risk for breast cancer, talk to your health care provider about having an MRI and a mammogram every year.  Breast cancer gene (BRCA) assessment is recommended for women who have family members with BRCA-related cancers. BRCA-related cancers include:  Breast.  Ovarian.  Tubal.  Peritoneal cancers.  Results of the assessment will determine the need for genetic counseling and BRCA1 and BRCA2 testing. Cervical Cancer Routine pelvic examinations to screen for cervical cancer are no longer recommended for nonpregnant women who are considered low risk for cancer of the pelvic organs (ovaries, uterus, and vagina) and who do not have symptoms. A pelvic examination may be necessary if you have symptoms including those associated with pelvic infections. Ask your health care provider if a screening pelvic exam is right for you.   The Pap test is the screening test for cervical cancer for women who are considered at risk.  If you had a hysterectomy for a problem that was not cancer or a condition that could lead to cancer, then you no longer need Pap tests.  If you are older than 65 years, and you have had normal Pap tests for the past 10 years, you no longer need to have Pap tests.  If you have had past treatment for cervical cancer or a condition that could lead to cancer, you need Pap tests and screening for cancer for at least 20 years after your treatment.  If you no longer get a Pap test, assess your risk factors if they change (such as having a new sexual partner). This can affect whether you should start being screened again.  Some women have medical problems that increase their chance of getting cervical cancer. If this is the case for you, your health care provider may recommend more frequent screening and Pap tests.  The human papillomavirus (HPV) test is another test that may be used for cervical cancer screening. The HPV test  looks for the virus that can cause cell changes in the cervix. The cells collected during the Pap test can be tested for HPV.  The HPV test can be used to screen women 30 years of age and older. Getting tested for HPV can extend the interval between normal Pap tests from three to five years.  An HPV test also should be used to screen women of any age who have unclear Pap test results.  After 28 years of age, women should have HPV testing as often as Pap tests.  Colorectal Cancer  This type of cancer can be detected and often prevented.  Routine colorectal cancer screening usually begins at 28 years of age and continues through 28 years of age.  Your health care provider may recommend screening at an earlier age if you have risk factors for colon cancer.  Your health care provider may also recommend using home test kits to check for hidden blood in the   stool.  A small camera at the end of a tube can be used to examine your colon directly (sigmoidoscopy or colonoscopy). This is done to check for the earliest forms of colorectal cancer.  Routine screening usually begins at age 26.  Direct examination of the colon should be repeated every 5-10 years through 28 years of age. However, you may need to be screened more often if early forms of precancerous polyps or small growths are found. Skin Cancer  Check your skin from head to toe regularly.  Tell your health care provider about any new moles or changes in moles, especially if there is a change in a mole's shape or color.  Also tell your health care provider if you have a mole that is larger than the size of a pencil eraser.  Always use sunscreen. Apply sunscreen liberally and repeatedly throughout the day.  Protect yourself by wearing long sleeves, pants, a wide-brimmed hat, and sunglasses whenever you are outside. HEART DISEASE, DIABETES, AND HIGH BLOOD PRESSURE   Have your blood pressure checked at least every 1-2 years. High blood  pressure causes heart disease and increases the risk of stroke.  If you are between 64 years and 29 years old, ask your health care provider if you should take aspirin to prevent strokes.  Have regular diabetes screenings. This involves taking a blood sample to check your fasting blood sugar level.  If you are at a normal weight and have a low risk for diabetes, have this test once every three years after 28 years of age.  If you are overweight and have a high risk for diabetes, consider being tested at a younger age or more often. PREVENTING INFECTION  Hepatitis B  If you have a higher risk for hepatitis B, you should be screened for this virus. You are considered at high risk for hepatitis B if:  You were born in a country where hepatitis B is common. Ask your health care provider which countries are considered high risk.  Your parents were born in a high-risk country, and you have not been immunized against hepatitis B (hepatitis B vaccine).  You have HIV or AIDS.  You use needles to inject street drugs.  You live with someone who has hepatitis B.  You have had sex with someone who has hepatitis B.  You get hemodialysis treatment.  You take certain medicines for conditions, including cancer, organ transplantation, and autoimmune conditions. Hepatitis C  Blood testing is recommended for:  Everyone born from [birth year] through 1965.  Anyone with known risk factors for hepatitis C. Sexually transmitted infections (STIs)  You should be screened for sexually transmitted infections (STIs) including gonorrhea and chlamydia if:  You are sexually active and are younger than 28 years of age.  You are older than 28 years of age and your health care provider tells you that you are at risk for this type of infection.  Your sexual activity has changed since you were last screened and you are at an increased risk for chlamydia or gonorrhea. Ask your health care provider if you are at  risk.  If you do not have HIV, but are at risk, it may be recommended that you take a prescription medicine daily to prevent HIV infection. This is called pre-exposure prophylaxis (PrEP). You are considered at risk if:  You are sexually active and do not regularly use condoms or know the HIV status of your partner(s).  You take drugs by injection.  You are  sexually active with a partner who has HIV. Talk with your health care provider about whether you are at high risk of being infected with HIV. If you choose to begin PrEP, you should first be tested for HIV. You should then be tested every 3 months for as long as you are taking PrEP.  PREGNANCY   If you are premenopausal and you may become pregnant, ask your health care provider about preconception counseling.  If you may become pregnant, take 400 to 800 micrograms (mcg) of folic acid every day.  If you want to prevent pregnancy, talk to your health care provider about birth control (contraception). OSTEOPOROSIS AND MENOPAUSE   Osteoporosis is a disease in which the bones lose minerals and strength with aging. This can result in serious bone fractures. Your risk for osteoporosis can be identified using a bone density scan.  If you are 70 years of age or older, or if you are at risk for osteoporosis and fractures, ask your health care provider if you should be screened.  Ask your health care provider whether you should take a calcium or vitamin D supplement to lower your risk for osteoporosis.  Menopause may have certain physical symptoms and risks.  Hormone replacement therapy may reduce some of these symptoms and risks. Talk to your health care provider about whether hormone replacement therapy is right for you.  HOME CARE INSTRUCTIONS   Schedule regular health, dental, and eye exams.  Stay current with your immunizations.   Do not use any tobacco products including cigarettes, chewing tobacco, or electronic cigarettes.  If  you are pregnant, do not drink alcohol.  If you are breastfeeding, limit how much and how often you drink alcohol.  Limit alcohol intake to no more than 1 drink per day for nonpregnant women. One drink equals 12 ounces of beer, 5 ounces of wine, or 1 ounces of hard liquor.  Do not use street drugs.  Do not share needles.  Ask your health care provider for help if you need support or information about quitting drugs.  Tell your health care provider if you often feel depressed.  Tell your health care provider if you have ever been abused or do not feel safe at home. Document Released: 01/03/2011 Document Revised: 11/04/2013 Document Reviewed: 05/22/2013 [facility] Patient Information 2015 [place], [place]. This information is not intended to replace advice given to you by your health care provider. Make sure you discuss any questions you have with your health care provider.

## 2014-03-17 NOTE — Progress Notes (Signed)
Subjective:    Patient ID: Misty Austin, female    DOB: Jun 02, 1986, 28 y.o.   MRN: [id number]  HPI  New patient to me, here to est ith new PCP, formerly at [facility] (I follow her mom and dad - Azzie Almas)  patient is here today for annual physical. Patient feels well and has no complaints.  Also reviewed chronic medical issues and interval medical events  Past Medical History  Diagnosis Date  . IBS (irritable bowel syndrome)   . Congenital kidney disease     familial nephritis  . GERD (gastroesophageal reflux disease)   . 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 Thyroid nodule     benign folliculr on bx 03/7988   Family History  Problem Relation Age of Onset  . Esophageal cancer Maternal Grandfather   . Diabetes Maternal Grandmother   . Kidney disease Mother     familial nephritis  . Diabetes Paternal Grandmother   . Breast cancer Maternal Grandmother   . Lymphoma Paternal Grandfather   . Leukemia Paternal Grandfather   . Melanoma Mother 49  . Basal cell carcinoma Mother    History  Substance Use Topics  . Smoking status: Never Smoker   . Smokeless tobacco: Never Used  . Alcohol Use: No    Review of Systems  Constitutional: Negative for fatigue and unexpected weight change.  Respiratory: Negative for cough, shortness of breath and wheezing.   Cardiovascular: Negative for chest pain, palpitations and leg swelling.  Gastrointestinal: Negative for nausea, abdominal pain and diarrhea.  Neurological: Negative for dizziness, weakness, light-headedness and headaches.  Psychiatric/Behavioral: Negative for dysphoric mood. The patient is not nervous/anxious.   All other systems reviewed and are negative.      Objective:   Physical Exam  BP 102/62  Pulse 70  Temp(Src) 97.8 F (36.6 C) (Oral)  Ht 5\' 5"  (1.651 m)  Wt 139 lb 8 oz (63.277 kg)  BMI 23.21 kg/m2  SpO2 99%  LMP 02/23/2014  Breastfeeding? No Wt Readings from Last 3  Encounters:  03/17/14 139 lb 8 oz (63.277 kg)  01/29/13 173 lb (78.472 kg)  11/30/11 137 lb 8 oz (62.37 kg)   Constitutional: She appears well-developed and well-nourished. No distress.  HENT: Head: Normocephalic and atraumatic. Ears: B TMs ok, no erythema or effusion; Nose: Nose normal. Mouth/Throat: Oropharynx is clear and moist. No oropharyngeal exudate.  Eyes: Conjunctivae and EOM are normal. Pupils are equal, round, and reactive to light. No scleral icterus.  Neck: Normal range of motion. Neck supple. No JVD present. No thyromegaly present.  Cardiovascular: Normal rate, regular rhythm and normal heart sounds.  No murmur heard. No BLE edema. Pulmonary/Chest: Effort normal and breath sounds normal. No respiratory distress. She has no wheezes.  Abdominal: Soft. Bowel sounds are normal. She exhibits no distension. There is no tenderness. no masses GU/breast: defer to gyn Musculoskeletal: Normal range of motion, no joint effusions. No gross deformities Neurological: She is alert and oriented to person, place, and time. No cranial nerve deficit. Coordination, balance, strength, speech and gait are normal.  Skin: Skin is warm and dry. No rash noted. No erythema.  Psychiatric: She has a normal mood and affect. Her behavior is normal. Judgment and thought content normal.    Lab Results  Component Value Date   WBC 13.0* 01/30/2013   HGB 9.7* 01/30/2013   HCT 28.2* 01/30/2013   PLT 166 01/30/2013   GLUCOSE 96 07/31/2009   ALT 13  07/31/2009   AST 17 07/31/2009   NA 141 07/31/2009   K 3.5 07/31/2009   CL 105 07/31/2009   CREATININE 0.7 07/31/2009   BUN 13 07/31/2009   CO2 30 07/31/2009   TSH 1.01 06/25/2010    US Biopsy  09/13/2013   CLINICAL DATA:  Patient with history of neck pain. Ultrasound reveals right thyroid nodule. Request is been made for thyroid nodule biopsy.  EXAM: ULTRASOUND GUIDED NEEDLE ASPIRATE BIOPSY OF THE THYROID GLAND  COMPARISON:  Ultrasound performed at outside facility. 8 mm  right thyroid nodule  PROCEDURE: Thyroid biopsy was thoroughly discussed with the patient and questions were answered. The benefits, risks, alternatives, and complications were also discussed. The patient understands and wishes to proceed with the procedure. Written consent was obtained.  Ultrasound was performed to localize and mark an adequate site for the biopsy. The patient was then prepped and draped in a normal sterile fashion. Local anesthesia was provided with 1% lidocaine. Using direct ultrasound guidance, 4 passes were made using needles into the nodule within the right lobe of the thyroid. Ultrasound was used to confirm needle placements on all occasions. Specimens were sent to Pathology for analysis.  Complications:  None  IMPRESSION: Ultrasound guided needle aspirate biopsy performed of the right thyroid nodule.  Read by: Jannifer Franklin [facility]   Electronically Signed   By: Jacqulynn Cadet M.D.   On: 09/13/2013 13:13       Assessment & Plan:   CPx/v70.0 - Patient has been counseled on age-appropriate routine health concerns for screening and prevention. These are reviewed and up-to-date. Immunizations are up-to-date or declined. Labs ordered and reviewed.

## 2014-03-26 ENCOUNTER — Other Ambulatory Visit (INDEPENDENT_AMBULATORY_CARE_PROVIDER_SITE_OTHER): Payer: 59

## 2014-03-26 DIAGNOSIS — Z0000 Encounter for general adult medical examination without abnormal findings: Secondary | ICD-10-CM

## 2014-03-26 LAB — BASIC METABOLIC PANEL
BUN: 16 mg/dL (ref 6–23)
CHLORIDE: 105 meq/L (ref 96–112)
CO2: 27 mEq/L (ref 19–32)
CREATININE: 0.6 mg/dL (ref 0.4–1.2)
Calcium: 8.9 mg/dL (ref 8.4–10.5)
GFR: 136.96 mL/min (ref >60.00)
Glucose, Bld: 90 mg/dL (ref 70–99)
Potassium: 4.4 mEq/L (ref 3.5–5.1)
SODIUM: 138 meq/L (ref 135–145)

## 2014-03-26 LAB — URINALYSIS, ROUTINE W REFLEX MICROSCOPIC
Bilirubin Urine: NEGATIVE
Ketones, ur: NEGATIVE
LEUKOCYTES UA: NEGATIVE
NITRITE: NEGATIVE
Specific Gravity, Urine: 1.015 (ref 1.000–1.030)
Total Protein, Urine: NEGATIVE
UROBILINOGEN UA: 0.2 (ref 0.0–1.0)
Urine Glucose: NEGATIVE
pH: 7 (ref 5.0–8.0)

## 2014-03-26 LAB — CBC WITH DIFFERENTIAL/PLATELET
BASOS PCT: 0.7 % (ref 0.0–3.0)
Basophils Absolute: 0 10*3/uL (ref 0.0–0.1)
EOS PCT: 5.1 % — AB (ref 0.0–5.0)
Eosinophils Absolute: 0.2 10*3/uL (ref 0.0–0.7)
HCT: 35 % — ABNORMAL LOW (ref 36.0–46.0)
HEMOGLOBIN: 11.8 g/dL — AB (ref 12.0–15.0)
LYMPHS PCT: 43.5 % (ref 12.0–46.0)
Lymphs Abs: 2.1 10*3/uL (ref 0.7–4.0)
MCHC: 33.7 g/dL (ref 30.0–36.0)
MCV: 90.9 fl (ref 78.0–100.0)
MONO ABS: 0.5 10*3/uL (ref 0.1–1.0)
Monocytes Relative: 9.4 % (ref 3.0–12.0)
NEUTROS ABS: 2 10*3/uL (ref 1.4–7.7)
NEUTROS PCT: 41.3 % — AB (ref 43.0–77.0)
Platelets: 247 10*3/uL (ref 150.0–400.0)
RBC: 3.85 Mil/uL — AB (ref 3.87–5.11)
RDW: 12.4 % (ref 11.5–15.5)
WBC: 4.8 10*3/uL (ref 4.0–10.5)

## 2014-03-26 LAB — HEPATIC FUNCTION PANEL
ALK PHOS: 71 U/L (ref 39–117)
ALT: 18 U/L (ref 0–35)
AST: 16 U/L (ref 0–37)
Albumin: 4 g/dL (ref 3.5–5.2)
BILIRUBIN DIRECT: 0.1 mg/dL (ref 0.0–0.3)
TOTAL PROTEIN: 7.1 g/dL (ref 6.0–8.3)
Total Bilirubin: 0.5 mg/dL (ref 0.2–1.2)

## 2014-03-26 LAB — LIPID PANEL
CHOL/HDL RATIO: 4
Cholesterol: 163 mg/dL (ref 0–200)
HDL: 40.1 mg/dL (ref >39.00)
LDL Cholesterol: 112 mg/dL — ABNORMAL HIGH (ref 0–99)
NONHDL: 122.9
Triglycerides: 57 mg/dL (ref 0.0–149.0)
VLDL: 11.4 mg/dL (ref 0.0–40.0)

## 2014-03-26 LAB — TSH: TSH: 0.74 u[IU]/mL (ref 0.35–4.50)

## 2014-05-05 ENCOUNTER — Encounter: Payer: Self-pay | Admitting: Internal Medicine

## 2014-07-04 NOTE — L&D Delivery Note (Signed)
Delivery Note Pt progressed rapidly to complete dilation and pushed great.  At 12:56 PM a healthy female was delivered via Vaginal, Spontaneous Delivery (Presentation: Right Occiput Anterior).  APGAR:8 , 9; weight  pending.   Placenta status: delivered spontaneously.  Cord: 3 vessels with the following complications: None.    Anesthesia: Epidural  Episiotomy: none  Lacerations:perineal abrasion--hemostatic and not repaired   Suture Repair: n/a Est. Blood Loss (mL):  263ml  Mom to postpartum.  Baby to Couplet care / Skin to Skin.  [name] W 05/12/2015, 1:20 PM

## 2014-10-17 LAB — OB RESULTS CONSOLE ANTIBODY SCREEN: ANTIBODY SCREEN: NEGATIVE

## 2014-10-17 LAB — OB RESULTS CONSOLE GC/CHLAMYDIA
Chlamydia: NEGATIVE
Gonorrhea: NEGATIVE

## 2014-10-17 LAB — OB RESULTS CONSOLE ABO/RH: RH TYPE: POSITIVE

## 2014-10-17 LAB — OB RESULTS CONSOLE HIV ANTIBODY (ROUTINE TESTING): HIV: NONREACTIVE

## 2014-10-17 LAB — OB RESULTS CONSOLE RUBELLA ANTIBODY, IGM: Rubella: IMMUNE

## 2014-10-17 LAB — OB RESULTS CONSOLE HEPATITIS B SURFACE ANTIGEN: Hepatitis B Surface Ag: NEGATIVE

## 2014-10-17 LAB — OB RESULTS CONSOLE RPR: RPR: NONREACTIVE

## 2015-04-08 LAB — OB RESULTS CONSOLE GBS: STREP GROUP B AG: NEGATIVE

## 2015-04-30 ENCOUNTER — Encounter (HOSPITAL_COMMUNITY): Payer: Self-pay | Admitting: *Deleted

## 2015-04-30 ENCOUNTER — Telehealth (HOSPITAL_COMMUNITY): Payer: Self-pay | Admitting: *Deleted

## 2015-04-30 NOTE — Telephone Encounter (Signed)
Preadmission screen  

## 2015-05-12 ENCOUNTER — Inpatient Hospital Stay (HOSPITAL_COMMUNITY): Payer: 59 | Admitting: Anesthesiology

## 2015-05-12 ENCOUNTER — Encounter (HOSPITAL_COMMUNITY): Payer: Self-pay

## 2015-05-12 ENCOUNTER — Inpatient Hospital Stay (HOSPITAL_COMMUNITY)
Admission: RE | Admit: 2015-05-12 | Discharge: 2015-05-13 | DRG: 775 | Disposition: A | Discharge status: Home / Self Care | Payer: 59 | Source: Ambulatory Visit | Attending: Obstetrics and Gynecology | Admitting: Obstetrics and Gynecology

## 2015-05-12 DIAGNOSIS — Z806 Family history of leukemia: Secondary | ICD-10-CM

## 2015-05-12 DIAGNOSIS — Z833 Family history of diabetes mellitus: Secondary | ICD-10-CM

## 2015-05-12 DIAGNOSIS — O26893 Other specified pregnancy related conditions, third trimester: Secondary | ICD-10-CM | POA: Diagnosis present

## 2015-05-12 DIAGNOSIS — O9962 Diseases of the digestive system complicating childbirth: Secondary | ICD-10-CM | POA: Diagnosis present

## 2015-05-12 DIAGNOSIS — K219 Gastro-esophageal reflux disease without esophagitis: Secondary | ICD-10-CM | POA: Diagnosis present

## 2015-05-12 DIAGNOSIS — Z3A39 39 weeks gestation of pregnancy: Secondary | ICD-10-CM | POA: Diagnosis not present

## 2015-05-12 LAB — CBC
HEMATOCRIT: 34.2 % — AB (ref 36.0–46.0)
HEMOGLOBIN: 11.7 g/dL — AB (ref 12.0–15.0)
MCH: 31.7 pg (ref 26.0–34.0)
MCHC: 34.2 g/dL (ref 30.0–36.0)
MCV: 92.7 fL (ref 78.0–100.0)
Platelets: 177 10*3/uL (ref 150–400)
RBC: 3.69 MIL/uL — AB (ref 3.87–5.11)
RDW: 12.3 % (ref 11.5–15.5)
WBC: 7.7 10*3/uL (ref 4.0–10.5)

## 2015-05-12 LAB — TYPE AND SCREEN
ABO/RH(D): O POS
ANTIBODY SCREEN: NEGATIVE

## 2015-05-12 LAB — RPR: RPR: NONREACTIVE

## 2015-05-12 MED ORDER — ZOLPIDEM TARTRATE 5 MG PO TABS: 5.0000 mg | ORAL_TABLET | Freq: Every evening | ORAL | Status: DC | PRN

## 2015-05-12 MED ORDER — TERBUTALINE SULFATE 1 MG/ML IJ SOLN
0.2500 mg | Freq: Once | INTRAMUSCULAR | Status: DC | PRN
  Filled 2015-05-12: qty 1

## 2015-05-12 MED ORDER — WITCH HAZEL-GLYCERIN EX PADS
1.0000 | MEDICATED_PAD | CUTANEOUS | Status: DC | PRN
Start: 2015-05-12 — End: 2015-05-13

## 2015-05-12 MED ORDER — SENNOSIDES-DOCUSATE SODIUM 8.6-50 MG PO TABS
2.0000 | ORAL_TABLET | ORAL | Status: DC
  Administered 2015-05-12: 2 via ORAL
  Filled 2015-05-12 (×2): qty 2

## 2015-05-12 MED ORDER — CITRIC ACID-SODIUM CITRATE 334-500 MG/5ML PO SOLN: 30.0000 mL | ORAL | Status: DC | PRN

## 2015-05-12 MED ORDER — LACTATED RINGERS IV SOLN
500.0000 mL | INTRAVENOUS | Status: DC | PRN
  Administered 2015-05-12: 500 mL via INTRAVENOUS

## 2015-05-12 MED ORDER — OXYCODONE-ACETAMINOPHEN 5-325 MG PO TABS: 2.0000 | ORAL_TABLET | ORAL | Status: DC | PRN

## 2015-05-12 MED ORDER — OXYTOCIN 40 UNITS IN LACTATED RINGERS INFUSION - SIMPLE MED: 62.5000 mL/h | INTRAVENOUS | Status: DC

## 2015-05-12 MED ORDER — LANOLIN HYDROUS EX OINT: TOPICAL_OINTMENT | CUTANEOUS | Status: DC | PRN

## 2015-05-12 MED ORDER — IBUPROFEN 600 MG PO TABS
600.0000 mg | ORAL_TABLET | Freq: Four times a day (QID) | ORAL | Status: DC
  Administered 2015-05-12 – 2015-05-13 (×5): 600 mg via ORAL
  Filled 2015-05-12 (×5): qty 1

## 2015-05-12 MED ORDER — DIPHENHYDRAMINE HCL 25 MG PO CAPS: 25.0000 mg | ORAL_CAPSULE | Freq: Four times a day (QID) | ORAL | Status: DC | PRN

## 2015-05-12 MED ORDER — LIDOCAINE HCL (PF) 1 % IJ SOLN
INTRAMUSCULAR | Status: DC | PRN
  Administered 2015-05-12 (×2): 4 mL

## 2015-05-12 MED ORDER — TETANUS-DIPHTH-ACELL PERTUSSIS 5-2.5-18.5 LF-MCG/0.5 IM SUSP: 0.5000 mL | Freq: Once | INTRAMUSCULAR | Status: DC

## 2015-05-12 MED ORDER — BENZOCAINE-MENTHOL 20-0.5 % EX AERO
1.0000 | INHALATION_SPRAY | CUTANEOUS | Status: DC | PRN
Start: 2015-05-12 — End: 2015-05-13

## 2015-05-12 MED ORDER — SIMETHICONE 80 MG PO CHEW: 80.0000 mg | CHEWABLE_TABLET | ORAL | Status: DC | PRN

## 2015-05-12 MED ORDER — OXYCODONE-ACETAMINOPHEN 5-325 MG PO TABS
1.0000 | ORAL_TABLET | ORAL | Status: DC | PRN
Start: 2015-05-12 — End: 2015-05-13

## 2015-05-12 MED ORDER — DIBUCAINE 1 % RE OINT: 1.0000 "application " | TOPICAL_OINTMENT | RECTAL | Status: DC | PRN

## 2015-05-12 MED ORDER — ONDANSETRON HCL 4 MG PO TABS: 4.0000 mg | ORAL_TABLET | ORAL | Status: DC | PRN

## 2015-05-12 MED ORDER — OXYTOCIN 40 UNITS IN LACTATED RINGERS INFUSION - SIMPLE MED
1.0000 m[IU]/min | INTRAVENOUS | Status: DC
  Administered 2015-05-12: 2 m[IU]/min via INTRAVENOUS
  Filled 2015-05-12: qty 1000

## 2015-05-12 MED ORDER — DIPHENHYDRAMINE HCL 50 MG/ML IJ SOLN: 12.5000 mg | INTRAMUSCULAR | Status: DC | PRN

## 2015-05-12 MED ORDER — OXYCODONE-ACETAMINOPHEN 5-325 MG PO TABS: 1.0000 | ORAL_TABLET | ORAL | Status: DC | PRN

## 2015-05-12 MED ORDER — ONDANSETRON HCL 4 MG/2ML IJ SOLN: 4.0000 mg | Freq: Four times a day (QID) | INTRAMUSCULAR | Status: DC | PRN

## 2015-05-12 MED ORDER — LACTATED RINGERS IV SOLN
INTRAVENOUS | Status: DC
  Administered 2015-05-12 (×2): via INTRAVENOUS

## 2015-05-12 MED ORDER — LIDOCAINE HCL (PF) 1 % IJ SOLN
30.0000 mL | INTRAMUSCULAR | Status: DC | PRN
  Filled 2015-05-12: qty 30

## 2015-05-12 MED ORDER — EPHEDRINE 5 MG/ML INJ
10.0000 mg | INTRAVENOUS | Status: DC | PRN
  Filled 2015-05-12: qty 2

## 2015-05-12 MED ORDER — FENTANYL 2.5 MCG/ML BUPIVACAINE 1/10 % EPIDURAL INFUSION (WH - ANES)
14.0000 mL/h | INTRAMUSCULAR | Status: DC | PRN
  Administered 2015-05-12 (×2): 14 mL/h via EPIDURAL
  Filled 2015-05-12: qty 125

## 2015-05-12 MED ORDER — ACETAMINOPHEN 325 MG PO TABS: 650.0000 mg | ORAL_TABLET | ORAL | Status: DC | PRN

## 2015-05-12 MED ORDER — ONDANSETRON HCL 4 MG/2ML IJ SOLN: 4.0000 mg | INTRAMUSCULAR | Status: DC | PRN

## 2015-05-12 MED ORDER — PRENATAL MULTIVITAMIN CH
1.0000 | ORAL_TABLET | Freq: Every day | ORAL | Status: DC
  Administered 2015-05-12 – 2015-05-13 (×2): 1 via ORAL
  Filled 2015-05-12 (×2): qty 1

## 2015-05-12 MED ORDER — OXYTOCIN BOLUS FROM INFUSION
500.0000 mL | INTRAVENOUS | Status: DC
  Administered 2015-05-12: 500 mL via INTRAVENOUS

## 2015-05-12 MED ORDER — PHENYLEPHRINE 40 MCG/ML (10ML) SYRINGE FOR IV PUSH (FOR BLOOD PRESSURE SUPPORT)
80.0000 ug | PREFILLED_SYRINGE | INTRAVENOUS | Status: DC | PRN
  Filled 2015-05-12: qty 20
  Filled 2015-05-12: qty 2

## 2015-05-12 NOTE — Lactation Note (Signed)
This note was copied from the chart of Misty Matty Deamer. Lactation Consultation Note  Patient Name: Misty Austin [id number] Date: 05/12/2015 Reason for consult: Initial assessment Experienced bf mom that reports feeding are going well. No questions or concerns at this time. No breast or nipple pain. She knows how to manually express and has a DEBP at home. Given lactation handouts. She is aware of OP services and support group.   Maternal Data Has patient been taught Hand Expression?: Yes Does the patient have breastfeeding experience prior to this delivery?: Yes  Feeding Feeding Type: Breast Fed Length of feed: 10 min  LATCH Score/Interventions Latch: Repeated attempts needed to sustain latch, nipple held in mouth throughout feeding, stimulation needed to elicit sucking reflex.  Audible Swallowing: A few with stimulation Intervention(s): Skin to skin  Type of Nipple: Everted at rest and after stimulation  Comfort (Breast/Nipple): Soft / non-tender     Hold (Positioning): Assistance needed to correctly position infant at breast and maintain latch. Intervention(s): Skin to skin  LATCH Score: 7  Lactation Tools Discussed/Used WIC Program: No   Consult Status Consult Status: Follow-up Date: 05/13/15 Follow-up type: In-patient    Denzil Hughes 05/12/2015, 10:30 PM

## 2015-05-12 NOTE — H&P (Signed)
Misty Austin is a 29 y.o. female G2P1001 at 35 5/7 weeks (EDD 05/14/15 by LMP c/w 10 week [place]) presenting for IOL as term and favorable.  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  Female babies have 50% inheritance and are affected significantly, needing transplants after adolescence.  Her baby is a female so wil have less significant form as a carrier.  Prenatal care otherwise uncomplicated except some fundal lag around 32 weeks but [place] 04/10/15 showed normal growth at the 32%ile, AFI 14.   Maternal Medical History:  Contractions: Frequency: irregular.   Perceived severity is mild.    Fetal activity: Perceived fetal activity is normal.    Prenatal Complications - Diabetes: none.    OB History    Gravida Para Term Preterm AB TAB SAB Ectopic Multiple Living   2 1 1       1     2014 NSVD female 6#10oz  Past Medical History  Diagnosis Date  . IBS (irritable bowel syndrome)   . Congenital kidney disease     familial nephritis  . GERD (gastroesophageal reflux disease)   . 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 Thyroid nodule     benign folliculr on bx 01/6159  . Hx of varicella   . Vaginal Pap smear, abnormal    Past Surgical History  Procedure Laterality Date  . Laparoscopic cholecystectomy  09/2009    thompson  . Wisdom tooth extraction     Family History: family history includes Basal cell carcinoma in her mother; Breast cancer in her maternal grandmother; Diabetes in her maternal grandmother and paternal grandmother; Esophageal cancer in her maternal grandfather; Kidney disease in her mother; Leukemia in her paternal grandfather; Lymphoma in her paternal grandfather; Melanoma (age of onset: 63) in her mother. Social History:  reports that she has never smoked. She has never used smokeless tobacco. She reports that she does not drink alcohol or use illicit  drugs.   Prenatal Transfer Tool  Maternal Diabetes: No Genetic Screening: Normal Maternal Ultrasounds/Referrals: Normal Fetal Ultrasounds or other Referrals:  None Maternal Substance Abuse:  No Significant Maternal Medications:  None Significant Maternal Lab Results:  None Other Comments:  Familial X-linked nephritis  Review of Systems  Neurological: Negative for headaches.      Last menstrual period 08/07/2014. Maternal Exam:  Uterine Assessment: Contraction strength is mild.  Contraction frequency is irregular.   Abdomen: Fetal presentation: vertex  Introitus: Normal vulva. Normal vagina.    Physical Exam  Constitutional: She is oriented to person, place, and time. She appears well-developed and well-nourished.  Cardiovascular: Normal rate and regular rhythm.   Respiratory: Effort normal.  GI: Soft.  Genitourinary: Vagina normal.  Neurological: She is alert and oriented to person, place, and time.  Psychiatric: She has a normal mood and affect.    Prenatal labs: ABO, Rh: O/Positive/-- (04/15 0000) Antibody: Negative (04/15 0000) Rubella: Immune (04/15 0000) RPR: Nonreactive (04/15 0000)  HBsAg: Negative (04/15 0000)  HIV: Non-reactive (04/15 0000)  GBS: Negative (10/05 0000)  Panorama and AFP negative CF negative One hour GTT 100  Assessment/Plan: Pt for IOL at term with favorable cervix, plan AROM and pitocin   Summer Parthasarathy W 05/12/2015, 6:31 AM

## 2015-05-12 NOTE — Progress Notes (Signed)
Patient ID: Misty Austin, female   DOB: 1986-04-05, 29 y.o.   MRN: [id number] Pt admitted and on pitocin, feeling mild contractions  FHR Category 1 afeb vss  Cervix 70/4/-2 AROM clear  Pt plans epidural, may have when desires

## 2015-05-12 NOTE — Anesthesia Postprocedure Evaluation (Signed)
  Anesthesia Post-op Note  Patient: Misty Austin  Procedure(s) Performed: * No procedures listed *  Patient Location: Mother/Baby  Anesthesia Type:Epidural  Level of Consciousness: awake  Airway and Oxygen Therapy: Patient Spontanous Breathing  Post-op Pain: mild  Post-op Assessment: Patient's Cardiovascular Status Stable and Respiratory Function Stable              Post-op Vital Signs: stable  Last Vitals:  Filed Vitals:   05/12/15 1540  BP: 120/60  Pulse: 74  Temp: 36.7 C  Resp: 16    Complications: No apparent anesthesia complications

## 2015-05-12 NOTE — Anesthesia Preprocedure Evaluation (Signed)
Anesthesia Evaluation  Patient identified by MRN, date of birth, ID band Patient awake    Reviewed: Allergy & Precautions, NPO status , Patient's Chart, lab work & pertinent test results  Airway Mallampati: II  TM Distance: >3 FB Neck ROM: Full    Dental no notable dental hx. (+) Teeth Intact   Pulmonary neg pulmonary ROS,    Pulmonary exam normal breath sounds clear to auscultation       Cardiovascular Normal cardiovascular exam Rhythm:Regular Rate:Normal     Neuro/Psych negative neurological ROS  negative psych ROS   GI/Hepatic GERD  Medicated and Controlled,IBS   Endo/Other  Hx/o benign thyroid nodule  Renal/GU Renal diseaseHx/o nephritis  negative genitourinary   Musculoskeletal negative musculoskeletal ROS (+)   Abdominal   Peds  Hematology negative hematology ROS (+)   Anesthesia Other Findings   Reproductive/Obstetrics (+) Pregnancy                             Anesthesia Physical Anesthesia Plan  ASA: II  Anesthesia Plan: Epidural   Post-op Pain Management:    Induction:   Airway Management Planned: Natural Airway  Additional Equipment:   Intra-op Plan:   Post-operative Plan:   Informed Consent: I have reviewed the patients History and Physical, chart, labs and discussed the procedure including the risks, benefits and alternatives for the proposed anesthesia with the patient or authorized representative who has indicated his/her understanding and acceptance.     Plan Discussed with: Anesthesiologist  Anesthesia Plan Comments:         Anesthesia Quick Evaluation

## 2015-05-12 NOTE — Anesthesia Procedure Notes (Signed)
Epidural Patient location during procedure: OB Start time: 05/12/2015 10:00 AM  Staffing Anesthesiologist: Josephine Igo Performed by: anesthesiologist   Preanesthetic Checklist Completed: patient identified, site marked, surgical consent, pre-op evaluation, timeout performed, IV checked, risks and benefits discussed and monitors and equipment checked  Epidural Patient position: sitting Prep: site prepped and draped and DuraPrep Patient monitoring: continuous pulse ox and blood pressure Approach: midline Location: L4-L5 Injection technique: LOR air  Needle:  Needle type: Tuohy  Needle gauge: 17 G Needle length: 9 cm and 9 Needle insertion depth: 5 cm cm Catheter type: closed end flexible Catheter size: 19 Gauge Catheter at skin depth: 10 cm Test dose: negative and Other  Assessment Events: blood not aspirated, injection not painful, no injection resistance, negative IV test and no paresthesia  Additional Notes Patient identified. Risks and benefits discussed including failed block, incomplete  Pain control, post dural puncture headache, nerve damage, paralysis, blood pressure Changes, nausea, vomiting, reactions to medications-both toxic and allergic and post Partum back pain. All questions were answered. Patient expressed understanding and wished to proceed. Sterile technique was used throughout procedure. Epidural site was Dressed with sterile barrier dressing. No paresthesias, signs of intravascular injection Or signs of intrathecal spread were encountered.  Patient was more comfortable after the epidural was dosed. Please see RN's note for documentation of vital signs and FHR which are stable.

## 2015-05-13 LAB — CBC
HEMATOCRIT: 32.6 % — AB (ref 36.0–46.0)
HEMOGLOBIN: 10.9 g/dL — AB (ref 12.0–15.0)
MCH: 31.1 pg (ref 26.0–34.0)
MCHC: 33.4 g/dL (ref 30.0–36.0)
MCV: 93.1 fL (ref 78.0–100.0)
Platelets: 161 10*3/uL (ref 150–400)
RBC: 3.5 MIL/uL — AB (ref 3.87–5.11)
RDW: 12.8 % (ref 11.5–15.5)
WBC: 10.7 10*3/uL — ABNORMAL HIGH (ref 4.0–10.5)

## 2015-05-13 MED ORDER — IBUPROFEN 600 MG PO TABS: 600.0000 mg | ORAL_TABLET | Freq: Four times a day (QID) | ORAL | Status: DC

## 2015-05-13 NOTE — Discharge Summary (Signed)
Obstetric Discharge Summary Reason for Admission: induction of labor Prenatal Procedures: none Intrapartum Procedures: spontaneous vaginal delivery Postpartum Procedures: none Complications-Operative and Postpartum: none HEMOGLOBIN  Date Value Ref Range Status  05/13/2015 10.9* 12.0 - 15.0 g/dL Final   HCT  Date Value Ref Range Status  05/13/2015 32.6* 36.0 - 46.0 % Final    Physical Exam:  General: alert and cooperative Lochia: appropriate Uterine Fundus: firm   Discharge Diagnoses: Term Pregnancy-delivered  Discharge Information: Date: 05/13/2015 Activity: pelvic rest Diet: routine Medications: Ibuprofen Condition: improved Instructions: refer to practice specific booklet Discharge to: home Follow-up Information    Follow up with Logan Bores, MD. Schedule an appointment as soon as possible for a visit in 6 weeks.   Specialty:  Obstetrics and Gynecology   Why:  postpartum   Contact information:   [address] [address] [phone]       Newborn Data: Live born female  Birth Weight: 7 lb 13.2 oz (3550 g) APGAR: 8, 9  Home with mother.  Logan Bores 05/13/2015, 9:08 AM

## 2015-05-13 NOTE — Progress Notes (Signed)
Post Partum Day 1 Subjective: no complaints and tolerating PO,  Requests early d/c home  Objective: Blood pressure 97/64, pulse 73, temperature 97.8 F (36.6 C), temperature source Oral, resp. rate 18, height 5\' 5"  (1.651 m), weight 77.565 kg (171 lb), last menstrual period 08/07/2014, SpO2 98 %, unknown if currently breastfeeding.  Physical Exam:  General: alert and cooperative Lochia: appropriate Uterine Fundus: firm    Recent Labs  05/12/15 0745 05/13/15 0510  HGB 11.7* 10.9*  HCT 34.2* 32.6*    Assessment/Plan: Discharge home   LOS: 1 day   Kolette Vey W 05/13/2015, 9:06 AM

## 2015-05-14 ENCOUNTER — Inpatient Hospital Stay (HOSPITAL_COMMUNITY): Admission: AD | Admit: 2015-05-14 | Payer: 59 | Source: Ambulatory Visit | Admitting: Obstetrics and Gynecology

## 2015-07-25 DIAGNOSIS — J029 Acute pharyngitis, unspecified: Secondary | ICD-10-CM | POA: Diagnosis not present

## 2015-08-19 DIAGNOSIS — R52 Pain, unspecified: Secondary | ICD-10-CM | POA: Diagnosis not present

## 2015-08-19 DIAGNOSIS — J029 Acute pharyngitis, unspecified: Secondary | ICD-10-CM | POA: Diagnosis not present

## 2015-09-03 MED FILL — NORETHINDRONE 0.35 MG TAB: 0.35 | 84 days supply | Qty: 84 | Fill #0 | Status: TO

## 2015-11-19 ENCOUNTER — Other Ambulatory Visit: Payer: Self-pay | Admitting: Emergency Medicine

## 2015-11-19 ENCOUNTER — Ambulatory Visit (INDEPENDENT_AMBULATORY_CARE_PROVIDER_SITE_OTHER): Payer: 59 | Admitting: Family Medicine

## 2015-11-19 ENCOUNTER — Encounter: Payer: Self-pay | Admitting: Family Medicine

## 2015-11-19 VITALS — BP 107/60 | HR 75 | Temp 98.2°F | Ht 65.0 in | Wt 149.2 lb

## 2015-11-19 DIAGNOSIS — J029 Acute pharyngitis, unspecified: Secondary | ICD-10-CM

## 2015-11-19 LAB — POCT RAPID STREP A (OFFICE): Rapid Strep A Screen: NEGATIVE

## 2015-11-19 NOTE — Patient Instructions (Addendum)
I will be in touch with your throat culture- however your rapid test is negative and I agree that allergies are likely to blame.  Take care!

## 2015-11-19 NOTE — Progress Notes (Signed)
[place] at [facility] [address] [place], [place] [address] [phone]  Date:  11/19/2015   Name:  GAYLEE DOORLEY   DOB:  1985-08-11   MRN:  [id number]  PCP:  Gwendolyn Grant, MD    Chief Complaint:   History of Present Illness:  Misty Austin is a 30 y.o. very pleasant female patient who presents with the following:  Here today with possible illness.  Her 30yo daugther has had strep throat 6-7 times since January. Pt hersel has noted an intermittent ST which she thinks is allergies.  She just wants to be sure that she is not a carrier and infecting her daughter  No fevers. No other symptoms- no cough,no sneezing, no runny nose She is generally in good health  She also has a 61 month old daughter who is in good health   Patient Active Problem List   Diagnosis Date Noted  . Indication for care in labor and delivery, antepartum 05/12/2015  . NSVD (normal spontaneous vaginal delivery) 01/29/2013  . ABDOMINAL PAIN OTHER SPECIFIED SITE 07/31/2009    Past Medical History  Diagnosis Date  . IBS (irritable bowel syndrome)   . Congenital kidney disease     familial nephritis  . GERD (gastroesophageal reflux disease)   . 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 Thyroid nodule     benign folliculr on bx [id number]  . Hx of varicella   . Vaginal Pap smear, abnormal     Past Surgical History  Procedure Laterality Date  . Laparoscopic cholecystectomy  09/2009    thompson  . Wisdom tooth extraction      Social History  Substance Use Topics  . Smoking status: Never Smoker   . Smokeless tobacco: Never Used  . Alcohol Use: No    Family History  Problem Relation Age of Onset  . Esophageal cancer Maternal Grandfather   . Diabetes Maternal Grandmother   . Kidney disease Mother     familial nephritis  . Diabetes Paternal Grandmother   . Breast cancer Maternal Grandmother   . Lymphoma  Paternal Grandfather   . Leukemia Paternal Grandfather   . Melanoma Mother 37  . Basal cell carcinoma Mother     No Known Allergies  Medication list has been reviewed and updated.  Current Outpatient Prescriptions on File Prior to Visit  Medication Sig Dispense Refill  . ibuprofen (ADVIL,MOTRIN) 600 MG tablet Take 1 tablet (600 mg total) by mouth every 6 (six) hours. 30 tablet 0  . Prenatal Vit-Fe Fumarate-FA (PRENATAL MULTIVITAMIN) TABS tablet Take 1 tablet by mouth daily at 12 noon.     No current facility-administered medications on file prior to visit.    Review of Systems:  As per HPI- otherwise negative.   Physical Examination: Filed Vitals:   11/19/15 0859  BP: 107/60  Pulse: 75  Temp: 98.2 F (36.8 C)   Filed Vitals:   11/19/15 0859  Height: 5\' 5"  (1.651 m)  Weight: 149 lb 3.2 oz (67.677 kg)   Body mass index is 24.83 kg/(m^2). Ideal Body Weight: Weight in (lb) to have BMI = 25: 149.9  GEN: WDWN, NAD, Non-toxic, A & O x 3, looks well HEENT: Atraumatic, Normocephalic. Neck supple. No masses, No LAD.  Bilateral TM wnl, oropharynx normal.  PEERL,EOMI.   Ears and Nose: No external deformity. CV: RRR, No M/G/R. No JVD. No thrill. No extra heart sounds.  PULM: CTA B, no wheezes, crackles, rhonchi. No retractions. No resp. distress. No accessory muscle use. EXTR: No c/c/e NEURO Normal gait.  PSYCH: Normally interactive. Conversant. Not depressed or anxious appearing.  Calm demeanor.    Assessment and Plan: Sore throat - Plan: POCT rapid strep A, Culture, Group A Strep  Here today seeking a strep screen to make sure she is not the source of her daughter's recurrent strep.  Her rapid is negative- await culture and will be in touch with her See patient instructions for more details.     Signed Lamar Blinks, MD

## 2015-11-19 NOTE — Progress Notes (Signed)
Pre visit review using our clinic review tool, if applicable. No additional management support is needed unless otherwise documented below in the visit note. 

## 2015-11-21 LAB — CULTURE, GROUP A STREP: Organism ID, Bacteria: NORMAL

## 2015-12-08 ENCOUNTER — Ambulatory Visit (INDEPENDENT_AMBULATORY_CARE_PROVIDER_SITE_OTHER): Payer: 59 | Admitting: Medical

## 2015-12-08 ENCOUNTER — Encounter: Payer: Self-pay | Admitting: Medical

## 2015-12-08 VITALS — BP 93/62 | HR 76 | Temp 97.9°F | Ht 65.0 in | Wt 148.8 lb

## 2015-12-08 DIAGNOSIS — J029 Acute pharyngitis, unspecified: Secondary | ICD-10-CM

## 2015-12-08 MED ORDER — AZITHROMYCIN 250 MG PO TABS: ORAL_TABLET | ORAL | Status: DC

## 2015-12-08 MED FILL — AZITHROMYCIN 250 MG TABLET: 250 | 5 days supply | Qty: 6 | Fill #0

## 2015-12-08 NOTE — Patient Instructions (Signed)
Will treat you clinically for strep in light of presentation and contact with daughter with strep.  Rx of azithromycin for strep.   Ibuprofen for bodyaches.  Follow up in 7 days or as needed

## 2015-12-08 NOTE — Progress Notes (Signed)
Pre visit review using our clinic tool,if applicable. No additional management support is needed unless otherwise documented below in the visit note.  

## 2015-12-08 NOTE — Progress Notes (Signed)
Subjective:    Patient ID: Misty Austin, female    DOB: [birth year], 30 y.o.   MRN: [id number]  HPI   Pt in with very  severe st. She has ha and body aches. Some joint pain. Symptoms started 2 days ago. Severe type st last night.   Pt 5 days ago daughter had strep.  Describes diffuse achiness of thorax and all over.   Review of Systems  Constitutional: Negative for fever, chills and fatigue.  HENT: Positive for sore throat. Negative for congestion, dental problem, nosebleeds, rhinorrhea, sinus pressure, sneezing, trouble swallowing and voice change.   Respiratory: Negative for cough, chest tightness, shortness of breath and wheezing.   Cardiovascular: Negative for chest pain and palpitations.  Gastrointestinal: Negative for abdominal pain.  Musculoskeletal: Positive for myalgias, joint swelling and arthralgias.  Neurological: Negative for dizziness.  Psychiatric/Behavioral: Negative for behavioral problems and confusion.   Past Medical History  Diagnosis Date  . IBS (irritable bowel syndrome)   . Congenital kidney disease     familial nephritis  . GERD (gastroesophageal reflux disease)   . 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 Thyroid nodule     benign folliculr on bx [id number]  . Hx of varicella   . Vaginal Pap smear, abnormal      Social History   Social History  . Marital Status: Married    Spouse Name: N/A  . Number of Children: 0  . Years of Education: N/A   Occupational History  . nurse    Social History Main Topics  . Smoking status: Never Smoker   . Smokeless tobacco: Never Used  . Alcohol Use: No  . Drug Use: No  . Sexual Activity: Yes   Other Topics Concern  . 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 Laparoscopic cholecystectomy  09/2009    thompson  . Wisdom tooth extraction      Family  History  Problem Relation Age of Onset  . Esophageal cancer Maternal Grandfather   . Diabetes Maternal Grandmother   . Kidney disease Mother     familial nephritis  . Diabetes Paternal Grandmother   . Breast cancer Maternal Grandmother   . Lymphoma Paternal Grandfather   . Leukemia Paternal Grandfather   . Melanoma Mother 48  . Basal cell carcinoma Mother     No Known Allergies  Current Outpatient Prescriptions on File Prior to Visit  Medication Sig Dispense Refill  . norethindrone (MICRONOR,CAMILA,ERRIN) 0.35 MG tablet Take 1 tablet by mouth daily.    . Prenatal Vit-Fe Fumarate-FA (PRENATAL MULTIVITAMIN) TABS tablet Take 1 tablet by mouth daily at 12 noon.    Marland Kitchen ibuprofen (ADVIL,MOTRIN) 600 MG tablet Take 1 tablet (600 mg total) by mouth every 6 (six) hours. (Patient not taking: Reported on 12/08/2015) 30 tablet 0   No current facility-administered medications on file prior to visit.    BP 93/62 mmHg  Pulse 76  Temp(Src) 97.9 F (36.6 C) (Oral)  Ht 5\' 5"  (1.651 m)  Wt 148 lb 12.8 oz (67.495 kg)  BMI 24.76 kg/m2  SpO2 99%  Breastfeeding? Yes       Objective:   Physical Exam  General  Mental Status - Alert. General Appearance - Well groomed. Not in acute distress.  Skin Rashes- No Rashes.  HEENT Head- Normal. Ear Auditory Canal - Left- Normal. Right - Normal.Tympanic Membrane- Left- Normal. Right- Normal. Eye Sclera/Conjunctiva- Left- Normal. Right- Normal. Nose & Sinuses Nasal Mucosa- Left-  Boggy and Congested. Right-  Boggy and  Congested.Bilateral maxillary and frontal sinus pressure. Mouth & Throat Lips: Upper Lip- Normal: no dryness, cracking, pallor, cyanosis, or vesicular eruption. Lower Lip-Normal: no dryness, cracking, pallor, cyanosis or vesicular eruption. Buccal Mucosa- Bilateral- No Aphthous ulcers. Oropharynx- No Discharge or Erythema. Tonsils: Characteristics- Bilateral-  Erythema and  Congestion. Size/Enlargement- Bilateral- 2= enlargement.  Discharge- bilateral-None.  Neck Neck- Supple. No Masses. Mild submandibular node mild tender   Chest and Lung Exam Auscultation: Breath Sounds:-Clear even and unlabored.  Cardiovascular Auscultation:Rythm- Regular, rate and rhythm. Murmurs & Other Heart Sounds:Ausculatation of the heart reveal- No Murmurs.  Lymphatic Head & Neck General Head & Neck Lymphatics: Bilateral: Description- submandibular node mild tender to palpation.       Assessment & Plan:  Will treat you clinically for strep in light of presentation and contact with daughter with strep.(discussed with pt that based on above would treat for strep even if rapid test negative. So she expressed not necessary to do rapid test and deferred).  Rx of azithromycin for strep.   Ibuprofen for bodyaches.  Follow up in 7 days or as needed

## 2015-12-21 DIAGNOSIS — B360 Pityriasis versicolor: Secondary | ICD-10-CM | POA: Diagnosis not present

## 2015-12-21 MED FILL — KETOCONAZOLE 2% CREAM: 2 | 21 days supply | Qty: 60 | Fill #0

## 2015-12-26 DIAGNOSIS — Z20818 Contact with and (suspected) exposure to other bacterial communicable diseases: Secondary | ICD-10-CM | POA: Diagnosis not present

## 2015-12-26 DIAGNOSIS — J029 Acute pharyngitis, unspecified: Secondary | ICD-10-CM | POA: Diagnosis not present

## 2016-02-05 MED FILL — NORETHINDRONE 0.35 MG TAB: 0.35 | 84 days supply | Qty: 84 | Fill #0

## 2016-02-29 MED FILL — ESCITALOPRAM 10 MG TABLET: 10 | 30 days supply | Qty: 30 | Fill #0

## 2016-03-25 DIAGNOSIS — O906 Postpartum mood disturbance: Secondary | ICD-10-CM | POA: Diagnosis not present

## 2016-03-28 MED FILL — ESCITALOPRAM 10 MG TABLET: 10 | 30 days supply | Qty: 30 | Fill #0

## 2016-04-27 DIAGNOSIS — O906 Postpartum mood disturbance: Secondary | ICD-10-CM | POA: Diagnosis not present

## 2016-04-27 MED FILL — DICLOFENAC SOD EC 50 MG TAB: 50 | 30 days supply | Qty: 60 | Fill #0

## 2016-05-02 MED FILL — NORETHINDRONE 0.35 MG TAB: 0.35 | 84 days supply | Qty: 84 | Fill #1

## 2016-05-02 MED FILL — ESCITALOPRAM 10 MG TABLET: 10 | 30 days supply | Qty: 30 | Fill #1

## 2016-05-03 ENCOUNTER — Ambulatory Visit: Payer: PRIVATE HEALTH INSURANCE | Attending: Sports Medicine | Admitting: Physical Therapy

## 2016-05-03 DIAGNOSIS — R293 Abnormal posture: Secondary | ICD-10-CM | POA: Diagnosis present

## 2016-05-03 DIAGNOSIS — M25512 Pain in left shoulder: Secondary | ICD-10-CM

## 2016-05-03 NOTE — Patient Instructions (Signed)
Scapular Retraction: Rowing (Eccentric) - Arms - Side (Resistance Band)    Hold end of band in each hand. Pull back until elbows are even with trunk. Keep elbows by sides, thumbs up. Slowly release for 3-5 seconds. Use __green______ resistance band. _10-15__ reps per set, _1-2__ sets per day, __7_ days per week.   http://ecce.exer.us/227   Copyright  VHI. All rights reserved.   CHEST: Doorway, Bilateral - Standing    Standing in doorway, place hands on wall with elbows bent at shoulder height. Lean forward. Hold __20-30_ seconds. _2-3__ reps per set, _1-2__ sets per day, _7__ days per week  Copyright  VHI. All rights reserved.

## 2016-05-03 NOTE — Therapy (Signed)
[facility] [address]  [place], [place], [address] Phone: [phone]   Fax:  [phone]  Physical Therapy Evaluation  Patient Details  Name: Misty Austin MRN: [id number] Date of Birth: Apr 20, 1986 Referring Provider: Verda Cumins, MD  Encounter Date: 05/03/2016      PT End of Session - 05/03/16 1354    Visit Number 1   Number of Visits 8   Date for PT Re-Evaluation 05/31/16   PT Start Time 1315   PT Stop Time 1346   PT Time Calculation (min) 31 min   Activity Tolerance Patient tolerated treatment well;Patient limited by pain   Behavior During Therapy [facility] for tasks assessed/performed      Past Medical History:  Diagnosis Date  . Congenital kidney disease    familial nephritis  . GERD (gastroesophageal reflux disease)   . 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 Hx of varicella   . IBS (irritable bowel syndrome)   . Thyroid nodule    benign folliculr on bx [id number]  . Vaginal Pap smear, abnormal     Past Surgical History:  Procedure Laterality Date  . LAPAROSCOPIC CHOLECYSTECTOMY  09/2009   thompson  . WISDOM TOOTH EXTRACTION      There were no vitals filed for this visit.       Subjective Assessment - 05/03/16 1314    Subjective Pt is a 30 y/o female who presents to [facility] with L shoulder pain following flu shot on 03/29/16.  Pt reports flu shot injected a little higher than anticipated.  Pt reports difficulty with movement and c/o numbness and tingling in LUE with increased activity.   Limitations Lifting;House hold activities   Patient Stated Goals improve pain, return to full work responsibilities   Currently in Pain? Yes   Pain Score 3   up to 8/10   Pain Location Shoulder   Pain Orientation Left   Pain Descriptors / Indicators Sharp;Shooting;Throbbing;Numbness;Aching   Pain Type Acute pain   Pain Radiating Towards numbness and tingling in hand   Pain Onset  More than a month ago   Pain Frequency Constant   Aggravating Factors  movement   Pain Relieving Factors immobilization            [facility] PT Assessment - 05/03/16 1317      Assessment   Medical Diagnosis L shoulder pain   Referring Provider Verda Cumins, MD   Onset Date/Surgical Date 03/29/16   Hand Dominance Right   Next MD Visit 1 month   Prior Therapy previously for knee     Restrictions   Other Position/Activity Restrictions no lifitng with LUE     Balance Screen   Has the patient fallen in the past 6 months No   Has the patient had a decrease in activity level because of a fear of falling?  No   Is the patient reluctant to leave their home because of a fear of falling?  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   Leisure spend time with children     Cognition   Overall Cognitive Status Within Functional Limits for tasks assessed     Observation/Other Assessments  Focus on Therapeutic Outcomes (FOTO)  55 (45% limited; predicted 31% limited)     Posture/Postural Control   Posture/Postural Control Postural limitations   Postural Limitations Rounded Shoulders;Forward head     AROM   Overall AROM Comments pain with flexion and abduction in sitting   AROM Assessment Site Shoulder   Left Shoulder Flexion 135 Degrees   Left Shoulder ABduction 180 Degrees   Left Shoulder Internal Rotation 80 Degrees   Left Shoulder External Rotation 89 Degrees     Strength   Overall Strength Comments limited by pain   Strength Assessment Site Shoulder;Elbow   Left Shoulder Flexion 4+/5   Left Shoulder ABduction 5/5   Left Shoulder Internal Rotation 5/5   Left Shoulder External Rotation 4/5   Right/Left Elbow Right;Left   Right Elbow Flexion 5/5    Right Elbow Extension 5/5   Left Elbow Flexion 5/5   Left Elbow Extension 5/5     Palpation   Palpation comment tenderness along L AC joint, supraspinatus tendon     Special Tests    Special Tests Rotator Cuff Impingement   Rotator Cuff Impingment tests Michel Bickers test     Hawkins-Kennedy test   Findings Negative   Side Left                   [facility] Adult PT Treatment/Exercise - 05/03/16 1317      Exercises   Exercises Shoulder     Shoulder Exercises: Standing   Row Both;10 reps;Theraband   Theraband Level (Shoulder Row) Level 3 (Green)   Row Limitations mod cues for scap retraction     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;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   Corner Stretch Limitations performed with arms down and at 90 degrees abdct; increased pain with abdct so recommended down only for HEP at this time                PT Education - 05/03/16 1353    Education provided Yes   Education Details HEP, clinical findings, POC   Person(s) Educated Patient   Methods Explanation;Demonstration;Handout   Comprehension Verbalized understanding;Returned demonstration;Need further instruction             PT Long Term Goals - 05/03/16 1400      PT LONG TERM GOAL #1   Title independent with HEP (05/31/16)   Time 4   Period Weeks   Status New     PT LONG TERM GOAL #2   Title report no pain with AROM L shoulder for improved function (05/31/16)   Time 4   Period Weeks   Status New     PT LONG TERM GOAL #3   Title return to full work duties with pain < 2/10 at end of day (05/31/16)   Time 4   Period Weeks   Status New               Plan - 05/03/16 1354    Clinical Impression Statement Pt is a 30 y/o female who presents to [facility] for L shoulder pain following flu shot.  Pt demonstrates decreased strength and pain with ROM affecting functional mobility and work responsibilities.  Pt also with numbness into hand with increased activity, and will  monitor response as needed.  Pt will benefit from PT to address deficits listed.   Rehab Potential Good   PT Frequency 2x / week   PT Duration 4 weeks   PT Treatment/Interventions ADLs/Self Care Home Management;Cryotherapy;Electrical Stimulation;Iontophoresis  4mg /ml Dexamethasone;Moist Heat;Ultrasound;Therapeutic exercise;Therapeutic activities;Patient/family education;Manual techniques;Passive range of motion;Vasopneumatic Device;Taping;Dry needling   PT Next Visit Plan ionto if order signed, review HEP, continue stretches/manual, scap strengthening   Consulted and Agree with Plan of Care Patient      Patient will benefit from skilled therapeutic intervention in order to improve the following deficits and impairments:  Pain, Impaired UE functional use, Postural dysfunction, Decreased strength  Visit Diagnosis: Acute pain of left shoulder - Plan: PT plan of care cert/re-cert  Abnormal posture - Plan: PT plan of care cert/re-cert     Problem List Patient Active Problem List   Diagnosis Date Noted  . Indication for care in labor and delivery, antepartum 05/12/2015  . NSVD (normal spontaneous vaginal delivery) 01/29/2013  . ABDOMINAL PAIN OTHER SPECIFIED SITE 07/31/2009      Laureen Abrahams, PT, DPT 05/03/16 2:08 PM    [facility] [address]  [place], [place], [address] Phone: [phone]   Fax:  [phone]  Name: Misty Austin MRN: [id number] Date of Birth: [birth year]

## 2016-05-05 ENCOUNTER — Ambulatory Visit: Payer: PRIVATE HEALTH INSURANCE | Attending: Sports Medicine | Admitting: Physical Therapy

## 2016-05-05 DIAGNOSIS — R293 Abnormal posture: Secondary | ICD-10-CM | POA: Insufficient documentation

## 2016-05-05 DIAGNOSIS — M25512 Pain in left shoulder: Secondary | ICD-10-CM | POA: Insufficient documentation

## 2016-05-05 NOTE — Therapy (Signed)
[facility] [address]  [place], [place], [address] Phone: [phone]   Fax:  [phone]  Physical Therapy Treatment  Patient Details  Name: Misty Austin MRN: [id number] Date of Birth: August 21, 1985 Referring Provider: Verda Cumins, MD  Encounter Date: 05/05/2016      PT End of Session - 05/05/16 1659    Visit Number 2   Number of Visits 8   Date for PT Re-Evaluation 05/31/16   Authorization Type Workers Comp   Authorization - Visit Number 2   Authorization - Number of Visits 12   PT Start Time 1619   PT Stop Time 1650   PT Time Calculation (min) 31 min   Activity Tolerance Patient tolerated treatment well;Patient limited by pain   Behavior During Therapy [facility] for tasks assessed/performed      Past Medical History:  Diagnosis Date  . Congenital kidney disease    familial nephritis  . GERD (gastroesophageal reflux disease)   . 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 Hx of varicella   . IBS (irritable bowel syndrome)   . Thyroid nodule    benign folliculr on bx [id number]  . Vaginal Pap smear, abnormal     Past Surgical History:  Procedure Laterality Date  . LAPAROSCOPIC CHOLECYSTECTOMY  09/2009   thompson  . WISDOM TOOTH EXTRACTION      There were no vitals filed for this visit.      Subjective Assessment - 05/05/16 1619    Subjective Patient reporting she is noticing her arm/hand is going numb more often with increased UE activity.    Patient Stated Goals improve pain, return to full work responsibilities   Currently in Pain? Yes   Pain Score 4    Pain Location Shoulder   Pain Orientation Left   Pain Descriptors / Indicators Aching   Pain Type Acute pain   Pain Frequency Constant   Aggravating Factors  movement, activity   Pain Relieving Factors resting, immobilization                         [facility] Adult PT Treatment/Exercise - 05/05/16 1621       Shoulder Exercises: Standing   External Rotation Both;15 reps   Theraband Level (Shoulder External Rotation) Level 1 (Yellow)  with scap squeeze   External Rotation Limitations ER in doorway with yellow t-band with elbow at side x 15 reps    Internal Rotation Left;15 reps   Theraband Level (Shoulder Internal Rotation) Level 1 (Yellow)   Internal Rotation Limitations with elbow at side   Retraction Both;15 reps   Other Standing Exercises bicep curl with green t-band x 15      Shoulder Exercises: ROM/Strengthening   UBE (Upper Arm Bike) Level 2' forward/backward 2 min each     Modalities   Modalities Ultrasound     Ultrasound   Ultrasound Location L shoulder   Ultrasound Parameters 3.97mHz, 1.2 w/cm2   Ultrasound Goals Pain     Manual Therapy   Manual Therapy Soft tissue mobilization   Soft tissue mobilization to L shoulder with good tissue quality                     PT Long Term Goals - 05/03/16 1400      PT LONG TERM GOAL #1   Title independent with HEP (05/31/16)   Time 4   Period Weeks  Status New     PT LONG TERM GOAL #2   Title report no pain with AROM L shoulder for improved function (05/31/16)   Time 4   Period Weeks   Status New     PT LONG TERM GOAL #3   Title return to full work duties with pain < 2/10 at end of day (05/31/16)   Time 4   Period Weeks   Status New               Plan - 05/05/16 1701    Clinical Impression Statement Patient with subjective reports of increased arm/hand numbness with increased activity duration. Patient demonstrating ability to perform scapular stabilization task with little resistance as patient is limited due to pain. Gentle ER and IR strengthening introduced today. US performed to L shoulder complex at [facility] joint with patient tolerating well.    PT Treatment/Interventions ADLs/Self Care Home Management;Cryotherapy;Electrical Stimulation;Iontophoresis 4mg /ml Dexamethasone;Moist  Heat;Ultrasound;Therapeutic exercise;Therapeutic activities;Patient/family education;Manual techniques;Passive range of motion;Vasopneumatic Device;Taping;Dry needling   PT Next Visit Plan ionto if order signed, review HEP, continue stretches/manual, scap strengthening   Consulted and Agree with Plan of Care Patient      Patient will benefit from skilled therapeutic intervention in order to improve the following deficits and impairments:  Pain, Impaired UE functional use, Postural dysfunction, Decreased strength  Visit Diagnosis: Acute pain of left shoulder  Abnormal posture     Problem List Patient Active Problem List   Diagnosis Date Noted  . Indication for care in labor and delivery, antepartum 05/12/2015  . NSVD (normal spontaneous vaginal delivery) 01/29/2013  . ABDOMINAL PAIN OTHER SPECIFIED SITE 07/31/2009       Lanney Gins, PT, DPT 05/05/16 5:05 PM     [facility] [address]  [place], [place], [address] Phone: [phone]   Fax:  [phone]  Name: Misty Austin MRN: [id number] Date of Birth: 05-29-1986

## 2016-05-11 ENCOUNTER — Ambulatory Visit: Payer: PRIVATE HEALTH INSURANCE | Attending: Sports Medicine | Admitting: Physical Therapy

## 2016-05-11 DIAGNOSIS — M25512 Pain in left shoulder: Secondary | ICD-10-CM | POA: Diagnosis not present

## 2016-05-11 DIAGNOSIS — R293 Abnormal posture: Secondary | ICD-10-CM | POA: Insufficient documentation

## 2016-05-11 NOTE — Therapy (Signed)
[facility] [address]  [place], [place], [address] Phone: [phone]   Fax:  [phone]  Physical Therapy Treatment  Patient Details  Name: Misty Austin MRN: [id number] Date of Birth: 07-22-1985 Referring Provider: Verda Cumins, MD  Encounter Date: 05/11/2016      PT End of Session - 05/11/16 1051    Visit Number 3   Number of Visits 8   Date for PT Re-Evaluation 05/31/16   Authorization Type Workers Comp   Authorization - Visit Number 3   Authorization - Number of Visits 12   PT Start Time 1015   PT Stop Time 1049  increase in symptoms with prolonged activity; unable to tolerate extended treatment session   PT Time Calculation (min) 34 min   Activity Tolerance Patient tolerated treatment well;Patient limited by pain   Behavior During Therapy [facility] for tasks assessed/performed      Past Medical History:  Diagnosis Date  . Congenital kidney disease    familial nephritis  . GERD (gastroesophageal reflux disease)   . 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 Hx of varicella   . IBS (irritable bowel syndrome)   . Thyroid nodule    benign folliculr on bx [id number]  . Vaginal Pap smear, abnormal     Past Surgical History:  Procedure Laterality Date  . LAPAROSCOPIC CHOLECYSTECTOMY  09/2009   thompson  . WISDOM TOOTH EXTRACTION      There were no vitals filed for this visit.      Subjective Assessment - 05/11/16 1017    Subjective Numbness is better; feels like she can move shoulder around a little more; "overall is better, I don't have as many bad days"   Limitations Lifting;House hold activities   Currently in Pain? Yes   Pain Score 3    Pain Location Shoulder   Pain Orientation Left   Pain Descriptors / Indicators Aching;Throbbing   Pain Type Acute pain   Pain Radiating Towards numbness and tingling in hand with increased activity   Pain Onset More than a month ago    Pain Frequency Intermittent   Aggravating Factors  movement, activity   Pain Relieving Factors resting, immobilization                         [facility] Adult PT Treatment/Exercise - 05/11/16 1019      Shoulder Exercises: Standing   Horizontal ABduction 15 reps;Theraband   Theraband Level (Shoulder Horizontal ABduction) Level 4 (Blue)   Horizontal ABduction Limitations with scap squeeze   External Rotation Both;15 reps;Theraband   Theraband Level (Shoulder External Rotation) Level 2 (Red)   External Rotation Limitations ER in doorway with red t-band with elbow at side x 15 reps    Internal Rotation Left;15 reps;Theraband   Theraband Level (Shoulder Internal Rotation) Level 2 (Red)   Internal Rotation Limitations with elbow at side   Row Both;15 reps;Theraband   Theraband Level (Shoulder Row) Level 4 (Blue)   Retraction Limitations HEP     Shoulder Exercises: ROM/Strengthening   UBE (Upper Arm Bike) Level 2 forward/backward 3 min each   Cybex Press 15 reps   Cybex Press Limitations 20# each hand hold   Cybex Row 15 reps   Cybex Row Limitations 20#     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     Modalities   Modalities Ultrasound  Ultrasound   Ultrasound Location L shoulder   Ultrasound Parameters 3.3 mHz, 1.5 w/cm2   Ultrasound Goals Pain                     PT Long Term Goals - 05/03/16 1400      PT LONG TERM GOAL #1   Title independent with HEP (05/31/16)   Time 4   Period Weeks   Status New     PT LONG TERM GOAL #2   Title report no pain with AROM L shoulder for improved function (05/31/16)   Time 4   Period Weeks   Status New     PT LONG TERM GOAL #3   Title return to full work duties with pain < 2/10 at end of day (05/31/16)   Time 4   Period Weeks   Status New               Plan - 05/11/16 1052    Clinical Impression Statement Patient continuing to report pain/numbness with increased  activity limiting overall treatment time. Patient demonstrating most difficulty with ER and overhead tasks. Will continue to benefit from PT to progress functional use of L UE/shoulder with decreased pain and numbness.    PT Treatment/Interventions ADLs/Self Care Home Management;Cryotherapy;Electrical Stimulation;Iontophoresis 4mg /ml Dexamethasone;Moist Heat;Ultrasound;Therapeutic exercise;Therapeutic activities;Patient/family education;Manual techniques;Passive range of motion;Vasopneumatic Device;Taping;Dry needling   PT Next Visit Plan ionto if order signed, review HEP, continue stretches/manual, scap strengthening   Consulted and Agree with Plan of Care Patient      Patient will benefit from skilled therapeutic intervention in order to improve the following deficits and impairments:  Pain, Impaired UE functional use, Postural dysfunction, Decreased strength  Visit Diagnosis: Acute pain of left shoulder  Abnormal posture     Problem List Patient Active Problem List   Diagnosis Date Noted  . Indication for care in labor and delivery, antepartum 05/12/2015  . NSVD (normal spontaneous vaginal delivery) 01/29/2013  . ABDOMINAL PAIN OTHER SPECIFIED SITE 07/31/2009      Lanney Gins, PT, DPT 05/11/16 10:56 AM     [facility] [address]  [place], [place], [address] Phone: [phone]   Fax:  [phone]  Name: Misty Austin MRN: [id number] Date of Birth: 1985-09-27

## 2016-05-12 ENCOUNTER — Ambulatory Visit: Payer: PRIVATE HEALTH INSURANCE | Admitting: Physical Therapy

## 2016-05-12 DIAGNOSIS — R293 Abnormal posture: Secondary | ICD-10-CM | POA: Diagnosis not present

## 2016-05-12 DIAGNOSIS — M25512 Pain in left shoulder: Secondary | ICD-10-CM

## 2016-05-12 NOTE — Therapy (Signed)
[facility] [address]  [place], [place], [address] Phone: [phone]   Fax:  [phone]  Physical Therapy Treatment  Patient Details  Name: Misty Austin MRN: [id number] Date of Birth: October 09, 1985 Referring Provider: Verda Cumins, MD  Encounter Date: 05/12/2016      PT End of Session - 05/12/16 1444    Visit Number 4   Number of Visits 8   Date for PT Re-Evaluation 05/31/16   Authorization Type Workers Comp   Authorization - Visit Number 4   Authorization - Number of Visits 12   PT Start Time 1400   PT Stop Time 1443   PT Time Calculation (min) 43 min   Activity Tolerance Patient tolerated treatment well   Behavior During Therapy [facility] for tasks assessed/performed      Past Medical History:  Diagnosis Date  . Congenital kidney disease    familial nephritis  . GERD (gastroesophageal reflux disease)   . 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 Hx of varicella   . IBS (irritable bowel syndrome)   . Thyroid nodule    benign folliculr on bx [id number]  . Vaginal Pap smear, abnormal     Past Surgical History:  Procedure Laterality Date  . LAPAROSCOPIC CHOLECYSTECTOMY  09/2009   thompson  . WISDOM TOOTH EXTRACTION      There were no vitals filed for this visit.      Subjective Assessment - 05/12/16 1403    Subjective feels shoulder has gotten a lot better, but still having some flare ups.  still having pain with overuse.   Patient Stated Goals improve pain, return to full work responsibilities   Currently in Pain? No/denies                         [facility] Adult PT Treatment/Exercise - 05/12/16 1404      Shoulder Exercises: Prone   Retraction Left;15 reps;Weights   Retraction Weight (lbs) 2   Flexion Left;15 reps;Weights   Flexion Weight (lbs) 2   Extension Left;15 reps;Weights   Extension Weight (lbs) 2   Horizontal ABduction 1 Left;15 reps;Weights   Horizontal ABduction 1 Weight (lbs) 2     Shoulder Exercises: Standing   Horizontal ABduction 15 reps;Theraband   Theraband Level (Shoulder Horizontal ABduction) Level 2 (Red)   Horizontal ABduction Limitations with scap squeeze; with noodle   External Rotation Both;15 reps;Theraband   Theraband Level (Shoulder External Rotation) Level 2 (Red)   External Rotation Limitations with noodle   Internal Rotation --   Theraband Level (Shoulder Internal Rotation) --   Internal Rotation Limitations --   Flexion Both;15 reps;Theraband   Theraband Level (Shoulder Flexion) Level 2 (Red)   Flexion Limitations with noodle   Extension Both;15 reps;Theraband   Theraband Level (Shoulder Extension) Level 4 (Blue)   Extension Limitations anchor height overhead   Other Standing Exercises D1/D2 L with blue theraband x 15 reps     Modalities   Modalities Iontophoresis;Ultrasound     Ultrasound   Ultrasound Location L shoulder   Ultrasound Parameters 1.0 mHz, 1.0 w/cm2, 100% x 8 min   Ultrasound Goals Pain     Iontophoresis   Type of Iontophoresis Dexamethasone   Location L shoulder   Dose 1.0 mL   Time 6 hour patch                PT Education - 05/12/16  1444    Education provided Yes   Education Details ionto   Person(s) Educated Patient   Methods Explanation;Handout   Comprehension Verbalized understanding             PT Long Term Goals - 05/03/16 1400      PT LONG TERM GOAL #1   Title independent with HEP (05/31/16)   Time 4   Period Weeks   Status New     PT LONG TERM GOAL #2   Title report no pain with AROM L shoulder for improved function (05/31/16)   Time 4   Period Weeks   Status New     PT LONG TERM GOAL #3   Title return to full work duties with pain < 2/10 at end of day (05/31/16)   Time 4   Period Weeks   Status New               Plan - 05/12/16 1445    Clinical Impression Statement Pt tolerated increased activity today with min reports of  numbness.  Ionto order signed so applied today to help with inflammation.  Will continue to benefit from PT to maximize function.   PT Treatment/Interventions ADLs/Self Care Home Management;Cryotherapy;Electrical Stimulation;Iontophoresis 4mg /ml Dexamethasone;Moist Heat;Ultrasound;Therapeutic exercise;Therapeutic activities;Patient/family education;Manual techniques;Passive range of motion;Vasopneumatic Device;Taping;Dry needling   PT Next Visit Plan assess response to ionto, review HEP, continue stretches/manual, scap strengthening   Consulted and Agree with Plan of Care Patient      Patient will benefit from skilled therapeutic intervention in order to improve the following deficits and impairments:  Pain, Impaired UE functional use, Postural dysfunction, Decreased strength  Visit Diagnosis: Acute pain of left shoulder  Abnormal posture     Problem List Patient Active Problem List   Diagnosis Date Noted  . Indication for care in labor and delivery, antepartum 05/12/2015  . NSVD (normal spontaneous vaginal delivery) 01/29/2013  . ABDOMINAL PAIN OTHER SPECIFIED SITE 07/31/2009       Laureen Abrahams, PT, DPT 05/12/16 2:46 PM    [facility] [address]  [place], [place], [address] Phone: [phone]   Fax:  [phone]  Name: Misty Austin MRN: [id number] Date of Birth: 01-23-1986

## 2016-05-12 NOTE — Patient Instructions (Signed)

## 2016-05-16 ENCOUNTER — Ambulatory Visit: Payer: PRIVATE HEALTH INSURANCE

## 2016-05-19 ENCOUNTER — Ambulatory Visit: Payer: PRIVATE HEALTH INSURANCE | Admitting: Physical Therapy

## 2016-05-19 DIAGNOSIS — M25512 Pain in left shoulder: Secondary | ICD-10-CM

## 2016-05-19 DIAGNOSIS — R293 Abnormal posture: Secondary | ICD-10-CM

## 2016-05-19 NOTE — Therapy (Addendum)
[facility] [address]  [place], [place], [address] Phone: [phone]   Fax:  [phone]  Physical Therapy Treatment  Patient Details  Name: Misty Austin MRN: [id number] Date of Birth: 11/24/1985 Referring Provider: Verda Cumins, MD  Encounter Date: 05/19/2016      PT End of Session - 05/19/16 1459    Visit Number 5   Authorization - Visit Number 5   Authorization - Number of Visits 12   PT Start Time 9242   PT Stop Time 6834   PT Time Calculation (min) 14 min   Activity Tolerance Patient tolerated treatment well   Behavior During Therapy [facility] for tasks assessed/performed      Past Medical History:  Diagnosis Date  . Congenital kidney disease    familial nephritis  . GERD (gastroesophageal reflux disease)   . 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 Hx of varicella   . IBS (irritable bowel syndrome)   . Thyroid nodule    benign folliculr on bx 07/9620  . Vaginal Pap smear, abnormal     Past Surgical History:  Procedure Laterality Date  . LAPAROSCOPIC CHOLECYSTECTOMY  09/2009   thompson  . WISDOM TOOTH EXTRACTION      There were no vitals filed for this visit.      Subjective Assessment - 05/19/16 1446    Subjective doing well, no pain. worked yesterday and no pain.   Patient Stated Goals improve pain, return to full work responsibilities   Currently in Pain? No/denies            [facility] PT Assessment - 05/19/16 1454      Observation/Other Assessments   Focus on Therapeutic Outcomes (FOTO)  82 (18% limited)     Strength   Overall Strength Comments no pain   Left Shoulder Flexion 5/5   Left Shoulder ABduction 5/5   Left Shoulder Internal Rotation 5/5   Left Shoulder External Rotation 5/5                     [facility] Adult PT Treatment/Exercise - 05/19/16 1446      Shoulder Exercises: Standing   Row Both;15 reps;Theraband   Theraband Level  (Shoulder Row) Level 3 (Green)     Shoulder Exercises: ROM/Strengthening   UBE (Upper Arm Bike) Level 2 forward/backward 2 min each     Shoulder Exercises: Stretch   Corner Stretch 3 reps;30 seconds                PT Education - 05/19/16 1502    Education provided Yes   Education Details POC, holding PT and pt to call if needed   Person(s) Educated Patient   Methods Explanation   Comprehension Verbalized understanding             PT Long Term Goals - 05/19/16 1459      PT LONG TERM GOAL #1   Title independent with HEP (05/31/16)   Status Achieved     PT LONG TERM GOAL #2   Title report no pain with AROM L shoulder for improved function (05/31/16)   Status Achieved     PT LONG TERM GOAL #3   Title return to full work duties with pain < 2/10 at end of day (05/31/16)   Status Achieved               Plan - 05/19/16 1459    Clinical  Impression Statement Pt has met all goals and strength has returned to 5/5 without pain.  Anticipate d/c but will hold x 30 days and pt to return if pain returns.   PT Treatment/Interventions ADLs/Self Care Home Management;Cryotherapy;Electrical Stimulation;Iontophoresis 105m/ml Dexamethasone;Moist Heat;Ultrasound;Therapeutic exercise;Therapeutic activities;Patient/family education;Manual techniques;Passive range of motion;Vasopneumatic Device;Taping;Dry needling   PT Next Visit Plan hold x 30 days; anticipate d/c though since pt without pain   Consulted and Agree with Plan of Care Patient      Patient will benefit from skilled therapeutic intervention in order to improve the following deficits and impairments:  Pain, Impaired UE functional use, Postural dysfunction, Decreased strength  Visit Diagnosis: Acute pain of left shoulder  Abnormal posture     Problem List Patient Active Problem List   Diagnosis Date Noted  . Indication for care in labor and delivery, antepartum 05/12/2015  . NSVD (normal spontaneous vaginal  delivery) 01/29/2013  . ABDOMINAL PAIN OTHER SPECIFIED SITE 07/31/2009       SLaureen Abrahams PT, DPT 05/19/16 3:03 PM    [facility] [address] [place] [address]Phone: [phone]  Fax:  [phone] Name: Misty ROSEMANMRN: [id number] of Birth: [birth year]     PHYSICAL THERAPY DISCHARGE SUMMARY  Visits from Start of Care: 5  Current functional level related to goals / functional outcomes: See above   Remaining deficits: n/a   Education / Equipment: HEP  Plan: Patient agrees to discharge.  Patient goals were met. Patient is being discharged due to meeting the stated rehab goals.  ?????    SLaureen Abrahams PT, DPT 06/15/16 2:25 PM  [facility] [address] [phone](office) [phone](fax)

## 2016-05-25 ENCOUNTER — Ambulatory Visit: Payer: PRIVATE HEALTH INSURANCE

## 2016-05-25 DIAGNOSIS — O906 Postpartum mood disturbance: Secondary | ICD-10-CM | POA: Diagnosis not present

## 2016-06-03 DIAGNOSIS — R102 Pelvic and perineal pain: Secondary | ICD-10-CM | POA: Diagnosis not present

## 2016-06-08 MED FILL — ESCITALOPRAM 10 MG TABLET: 10 | 30 days supply | Qty: 30 | Fill #2

## 2016-06-22 DIAGNOSIS — O906 Postpartum mood disturbance: Secondary | ICD-10-CM | POA: Diagnosis not present

## 2016-06-28 DIAGNOSIS — H5203 Hypermetropia, bilateral: Secondary | ICD-10-CM | POA: Diagnosis not present

## 2016-07-12 DIAGNOSIS — Z130 Encounter for screening for diseases of the blood and blood-forming organs and certain disorders involving the immune mechanism: Secondary | ICD-10-CM | POA: Diagnosis not present

## 2016-07-12 DIAGNOSIS — Z3041 Encounter for surveillance of contraceptive pills: Secondary | ICD-10-CM | POA: Diagnosis not present

## 2016-07-12 DIAGNOSIS — Z0000 Encounter for general adult medical examination without abnormal findings: Secondary | ICD-10-CM | POA: Diagnosis not present

## 2016-07-12 DIAGNOSIS — Z1389 Encounter for screening for other disorder: Secondary | ICD-10-CM | POA: Diagnosis not present

## 2016-07-12 DIAGNOSIS — Z1151 Encounter for screening for human papillomavirus (HPV): Secondary | ICD-10-CM | POA: Diagnosis not present

## 2016-07-12 DIAGNOSIS — O99345 Other mental disorders complicating the puerperium: Secondary | ICD-10-CM | POA: Diagnosis not present

## 2016-07-12 DIAGNOSIS — Z124 Encounter for screening for malignant neoplasm of cervix: Secondary | ICD-10-CM | POA: Diagnosis not present

## 2016-07-12 DIAGNOSIS — Z6824 Body mass index (BMI) 24.0-24.9, adult: Secondary | ICD-10-CM | POA: Diagnosis not present

## 2016-07-12 DIAGNOSIS — Z01419 Encounter for gynecological examination (general) (routine) without abnormal findings: Secondary | ICD-10-CM | POA: Diagnosis not present

## 2016-07-12 MED FILL — ESCITALOPRAM 10 MG TABLET: 10 | 30 days supply | Qty: 30 | Fill #0 | Status: TO

## 2016-07-12 MED FILL — LARIN 1.5 MG-30 MCG TABLET: 1.5-30 | 63 days supply | Qty: 63 | Fill #0

## 2016-07-18 ENCOUNTER — Encounter (HOSPITAL_BASED_OUTPATIENT_CLINIC_OR_DEPARTMENT_OTHER): Payer: Self-pay | Admitting: *Deleted

## 2016-07-18 ENCOUNTER — Emergency Department (HOSPITAL_BASED_OUTPATIENT_CLINIC_OR_DEPARTMENT_OTHER)
Admission: EM | Admit: 2016-07-18 | Discharge: 2016-07-18 | Disposition: A | Discharge status: Home / Self Care | Payer: 59 | Attending: Emergency Medicine | Admitting: Emergency Medicine

## 2016-07-18 DIAGNOSIS — R112 Nausea with vomiting, unspecified: Secondary | ICD-10-CM | POA: Insufficient documentation

## 2016-07-18 DIAGNOSIS — R197 Diarrhea, unspecified: Secondary | ICD-10-CM | POA: Insufficient documentation

## 2016-07-18 DIAGNOSIS — Z79899 Other long term (current) drug therapy: Secondary | ICD-10-CM | POA: Diagnosis not present

## 2016-07-18 DIAGNOSIS — R1032 Left lower quadrant pain: Secondary | ICD-10-CM | POA: Diagnosis not present

## 2016-07-18 DIAGNOSIS — R1012 Left upper quadrant pain: Secondary | ICD-10-CM | POA: Insufficient documentation

## 2016-07-18 DIAGNOSIS — R1110 Vomiting, unspecified: Secondary | ICD-10-CM

## 2016-07-18 MED ORDER — ONDANSETRON 4 MG PO TBDP: ORAL_TABLET | ORAL | 0 refills | Status: DC

## 2016-07-18 MED ORDER — ONDANSETRON HCL 4 MG/2ML IJ SOLN
4.0000 mg | Freq: Once | INTRAMUSCULAR | Status: AC
  Administered 2016-07-18: 4 mg via INTRAVENOUS
  Filled 2016-07-18: qty 2

## 2016-07-18 MED ORDER — METOCLOPRAMIDE HCL 5 MG/ML IJ SOLN
10.0000 mg | Freq: Once | INTRAMUSCULAR | Status: AC
  Administered 2016-07-18: 10 mg via INTRAVENOUS
  Filled 2016-07-18: qty 2

## 2016-07-18 MED ORDER — SODIUM CHLORIDE 0.9 % IV BOLUS (SEPSIS)
1000.0000 mL | Freq: Once | INTRAVENOUS | Status: AC
  Administered 2016-07-18: 1000 mL via INTRAVENOUS

## 2016-07-18 MED FILL — ONDANSETRON ODT 4 MG TABLET: 4 | 1 days supply | Qty: 4 | Fill #0

## 2016-07-18 NOTE — ED Notes (Signed)
Pt directed to pharmacy to pick up Rx. Pt has a ride at bedside 

## 2016-07-18 NOTE — ED Notes (Signed)
Ice chips given per pt request

## 2016-07-18 NOTE — ED Notes (Signed)
Pt states she feels better. Dr. Tamera Punt notified

## 2016-07-18 NOTE — ED Provider Notes (Signed)
[facility] DEPT [facility] Provider Note   CSN: [id number] Arrival date & time: 07/18/16  [id number]     History   Chief Complaint Chief Complaint  Patient presents with  . Emesis    HPI Misty Austin is a 31 y.o. female.  Patient is a 31 year old female who presents with nausea and vomiting. She states that 2 days ago she started having nausea and vomiting and then yesterday began having watery diarrhea. She has associated abdominal cramping. She feels like she's followed bit dehydrated. She denies any known fevers. No runny nose coughing or congestion. No myalgias. She did take some Imodium at home with no improvement in symptoms.      Past Medical History:  Diagnosis Date  . Congenital kidney disease    familial nephritis  . GERD (gastroesophageal reflux disease)   . 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 Hx of varicella   . IBS (irritable bowel syndrome)   . Thyroid nodule    benign folliculr on bx [id number]  . Vaginal Pap smear, abnormal     Patient Active Problem List   Diagnosis Date Noted  . Indication for care in labor and delivery, antepartum 05/12/2015  . NSVD (normal spontaneous vaginal delivery) 01/29/2013  . ABDOMINAL PAIN OTHER SPECIFIED SITE 07/31/2009    Past Surgical History:  Procedure Laterality Date  . LAPAROSCOPIC CHOLECYSTECTOMY  09/2009   thompson  . WISDOM TOOTH EXTRACTION      OB History    Gravida Para Term Preterm AB Living   2 2 2     2    SAB TAB Ectopic Multiple Live Births         0 2       Home Medications    Prior to Admission medications   Medication Sig Start Date End Date Taking? Authorizing Provider  escitalopram (LEXAPRO) 10 MG tablet Take 10 mg by mouth daily.   Yes Historical Provider, MD  norethindrone (MICRONOR,CAMILA,ERRIN) 0.35 MG tablet Take 1 tablet by mouth daily. 06/25/15  Yes Historical Provider, MD  ondansetron (ZOFRAN ODT) 4 MG disintegrating tablet 4mg  ODT q4 hours prn nausea/vomit  07/18/16   Malvin Johns, MD    Family History Family History  Problem Relation Age of Onset  . Esophageal cancer Maternal Grandfather   . Diabetes Maternal Grandmother   . Breast cancer Maternal Grandmother   . Lymphoma Paternal Grandfather   . Leukemia Paternal Grandfather   . Diabetes Paternal Grandmother   . Kidney disease Mother     familial nephritis  . Melanoma Mother 52  . Basal cell carcinoma Mother     Social History Social History  Substance Use Topics  . Smoking status: Never Smoker  . Smokeless tobacco: Never Used  . Alcohol use No     Allergies   Patient has no known allergies.   Review of Systems Review of Systems  Constitutional: Negative for chills, diaphoresis, fatigue and fever.  HENT: Negative for congestion, rhinorrhea and sneezing.   Eyes: Negative.   Respiratory: Negative for cough, chest tightness and shortness of breath.   Cardiovascular: Negative for chest pain and leg swelling.  Gastrointestinal: Positive for abdominal pain, diarrhea, nausea and vomiting. Negative for blood in stool.  Genitourinary: Negative for difficulty urinating, flank pain, frequency and hematuria.  Musculoskeletal: Negative for arthralgias and back pain.  Skin: Negative for rash.  Neurological: Negative for dizziness, speech difficulty, weakness, numbness and headaches.     Physical Exam Updated Vital Signs  BP 93/63 (BP Location: Left Arm)   Pulse 75   Temp 97.5 F (36.4 C) (Oral)   Resp 16   Ht 5\' 5"  (1.651 m)   Wt 139 lb (63 kg)   LMP 07/07/2016   SpO2 100%   BMI 23.13 kg/m   Physical Exam  Constitutional: She is oriented to person, place, and time. She appears well-developed and well-nourished.  HENT:  Head: Normocephalic and atraumatic.  Eyes: Pupils are equal, round, and reactive to light.  Neck: Normal range of motion. Neck supple.  Cardiovascular: Normal rate, regular rhythm and normal heart sounds.   Pulmonary/Chest: Effort normal and breath  sounds normal. No respiratory distress. She has no wheezes. She has no rales. She exhibits no tenderness.  Abdominal: Soft. Bowel sounds are normal. There is tenderness (Mild tenderness left upper and lower abdomen). There is no rebound and no guarding.  Musculoskeletal: Normal range of motion. She exhibits no edema.  Lymphadenopathy:    She has no cervical adenopathy.  Neurological: She is alert and oriented to person, place, and time.  Skin: Skin is warm and dry. No rash noted.  Psychiatric: She has a normal mood and affect.     ED Treatments / Results  Labs (all labs ordered are listed, but only abnormal results are displayed) Labs Reviewed - No data to display  EKG  EKG Interpretation None       Radiology No results found.  Procedures Procedures (including critical care time)  Medications Ordered in ED Medications  sodium chloride 0.9 % bolus 1,000 mL (0 mLs Intravenous Stopped 07/18/16 1006)  ondansetron (ZOFRAN) injection 4 mg (4 mg Intravenous Given 07/18/16 0925)  metoCLOPramide (REGLAN) injection 10 mg (10 mg Intravenous Given 07/18/16 1133)  sodium chloride 0.9 % bolus 1,000 mL (0 mLs Intravenous Stopped 07/18/16 1215)     Initial Impression / Assessment and Plan / ED Course  I have reviewed the triage vital signs and the nursing notes.  Pertinent labs & imaging results that were available during my care of the patient were reviewed by me and considered in my medical decision making (see chart for details).  Clinical Course     Patient presents with vomiting diarrhea. She was given IV fluids in the ED as well as anti-medics. She's feeling much better. She's no longer lightheaded. She is able to ambulate without difficulty. She's tolerating ice chips without difficulty. She's ready to go home. She has no ongoing abdominal pain. She was discharged home in good condition. She was given prescription for Zofran. Return precautions were given.  Final Clinical  Impressions(s) / ED Diagnoses   Final diagnoses:  Vomiting and diarrhea    New Prescriptions New Prescriptions   ONDANSETRON (ZOFRAN ODT) 4 MG DISINTEGRATING TABLET    4mg  ODT q4 hours prn nausea/vomit     Malvin Johns, MD 07/18/16 1357

## 2016-07-18 NOTE — ED Triage Notes (Signed)
C/o n/v since Saturday. Diarrhea started yesterday. All over abd pain.  Low grade fever on Saturday.

## 2016-08-03 ENCOUNTER — Ambulatory Visit (INDEPENDENT_AMBULATORY_CARE_PROVIDER_SITE_OTHER): Payer: 59 | Admitting: Medical

## 2016-08-03 ENCOUNTER — Encounter: Payer: Self-pay | Admitting: Medical

## 2016-08-03 VITALS — BP 97/67 | HR 92 | Temp 98.2°F | Ht 65.0 in | Wt 149.4 lb

## 2016-08-03 DIAGNOSIS — R05 Cough: Secondary | ICD-10-CM

## 2016-08-03 DIAGNOSIS — J0100 Acute maxillary sinusitis, unspecified: Secondary | ICD-10-CM | POA: Diagnosis not present

## 2016-08-03 DIAGNOSIS — J029 Acute pharyngitis, unspecified: Secondary | ICD-10-CM | POA: Diagnosis not present

## 2016-08-03 DIAGNOSIS — R52 Pain, unspecified: Secondary | ICD-10-CM | POA: Diagnosis not present

## 2016-08-03 DIAGNOSIS — R059 Cough, unspecified: Secondary | ICD-10-CM

## 2016-08-03 LAB — POCT RAPID STREP A (OFFICE): RAPID STREP A SCREEN: NEGATIVE

## 2016-08-03 LAB — POC INFLUENZA A&B (BINAX/QUICKVUE)
INFLUENZA A, POC: NEGATIVE
Influenza B, POC: NEGATIVE

## 2016-08-03 MED ORDER — BENZONATATE 100 MG PO CAPS: 100.0000 mg | ORAL_CAPSULE | Freq: Three times a day (TID) | ORAL | 0 refills | Status: DC | PRN

## 2016-08-03 MED ORDER — AMOXICILLIN-POT CLAVULANATE 875-125 MG PO TABS: 1.0000 | ORAL_TABLET | Freq: Two times a day (BID) | ORAL | 0 refills | Status: DC

## 2016-08-03 MED ORDER — FLUTICASONE PROPIONATE 50 MCG/ACT NA SUSP
2.0000 | Freq: Every day | NASAL | 1 refills | Status: DC
Start: 2016-08-03 — End: 2018-02-12

## 2016-08-03 MED FILL — FLUTICASONE PROP 50 MCG SPR: 50 | 30 days supply | Qty: 16 | Fill #0

## 2016-08-03 MED FILL — AMOX-CLAV 875-125 MG TABLET: 875-125 | 10 days supply | Qty: 20 | Fill #0

## 2016-08-03 MED FILL — BENZONATATE 100 MG CAP: 100 | 7 days supply | Qty: 21 | Fill #0

## 2016-08-03 NOTE — Progress Notes (Signed)
Pre visit review using our clinic tool,if applicable. No additional management support is needed unless otherwise documented below in the visit note.  

## 2016-08-03 NOTE — Patient Instructions (Addendum)
With your recent st, enlarged tonsils by exam, and close contract to daughter with strep I will rx augmentin antibiotic. Augmentin has some coverage for sinus infection and bronchitis as well.  For cough rx benzonatate. For nasal congestion rx flonase.  Tylenol for fever.  Follow up in 7 days or as needed  Your flu test was negative. Strep was negative but possible false negative test.

## 2016-08-03 NOTE — Progress Notes (Signed)
Subjective:    Patient ID: Tarry Kos, female    DOB: 12-11-1985, 31 y.o.   MRN: [id number]  HPI    Pt in with recent st(mild st presently). Pt had head and chest congestion since Sunday night. No fever. No chills, no sweats and no body aches.   Pt has some cough and bringing up mucous.  LMP- Jul 07, 2016.  Pt 2 month old has strep.   Review of Systems  Constitutional: Negative for chills, fatigue and fever.  HENT: Positive for congestion and sore throat. Negative for ear pain, postnasal drip, sinus pain, sinus pressure, tinnitus and voice change.   Respiratory: Positive for cough. Negative for chest tightness, shortness of breath and wheezing.   Cardiovascular: Negative for chest pain and palpitations.  Gastrointestinal: Negative for abdominal pain, diarrhea and nausea.  Genitourinary: Negative for dysuria, flank pain and frequency.  Musculoskeletal: Negative for back pain, myalgias and neck stiffness.  Skin: Negative for rash.  Neurological: Negative for dizziness, speech difficulty, weakness and headaches.  Hematological: Negative for adenopathy. Does not bruise/bleed easily.  Psychiatric/Behavioral: Negative for behavioral problems and decreased concentration.    Past Medical History:  Diagnosis Date  . Congenital kidney disease    familial nephritis  . GERD (gastroesophageal reflux disease)   . 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 Hx of varicella   . IBS (irritable bowel syndrome)   . Thyroid nodule    benign folliculr on bx [id number]  . Vaginal Pap smear, abnormal      Social History   Social History  . Marital status: Married    Spouse name: N/A  . Number of children: 0  . Years of education: N/A   Occupational History  . nurse [employer]   Social History Main Topics  . Smoking status: Never Smoker  . Smokeless tobacco: Never Used  . Alcohol use No  . Drug use: No  . Sexual activity: Yes   Other Topics  Concern  . 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 LAPAROSCOPIC CHOLECYSTECTOMY  09/2009   thompson  . WISDOM TOOTH EXTRACTION      Family History  Problem Relation Age of Onset  . Esophageal cancer Maternal Grandfather   . Diabetes Maternal Grandmother   . Breast cancer Maternal Grandmother   . Lymphoma Paternal Grandfather   . Leukemia Paternal Grandfather   . Diabetes Paternal Grandmother   . Kidney disease Mother     familial nephritis  . Melanoma Mother 68  . Basal cell carcinoma Mother     No Known Allergies  Current Outpatient Prescriptions on File Prior to Visit  Medication Sig Dispense Refill  . escitalopram (LEXAPRO) 10 MG tablet Take 10 mg by mouth daily.    . norethindrone (MICRONOR,CAMILA,ERRIN) 0.35 MG tablet Take 1 tablet by mouth daily.    . ondansetron (ZOFRAN ODT) 4 MG disintegrating tablet 4mg  ODT q4 hours prn nausea/vomit (Patient not taking: Reported on 08/03/2016) 4 tablet 0   No current facility-administered medications on file prior to visit.     BP 97/67   Pulse 92   Temp 98.2 F (36.8 C) (Oral)   Ht 5\' 5"  (1.651 m)   Wt 149 lb 6.4 oz (67.8 kg)   LMP 07/07/2016   SpO2  99%   BMI 24.86 kg/m       Objective:   Physical Exam  General  Mental Status - Alert. General Appearance - Well groomed. Not in acute distress.  Skin Rashes- No Rashes.  HEENT Head- Normal. Ear Auditory Canal - Left- Normal. Right - Normal.Tympanic Membrane- Left- Normal. Right- Normal. Eye Sclera/Conjunctiva- Left- Normal. Right- Normal. Nose & Sinuses Nasal Mucosa- Left-  Boggy and Congested. Right-  Boggy and  Congested.Bilateral maxillary and frontal sinus pressure. Mouth & Throat Lips: Upper Lip- Normal: no dryness, cracking, pallor, cyanosis, or vesicular eruption. Lower Lip-Normal: no dryness, cracking, pallor, cyanosis or vesicular  eruption. Buccal Mucosa- Bilateral- No Aphthous ulcers. Oropharynx- No Discharge or Erythema. Tonsils: Characteristics- Bilateral- moderate  Erythema or Congestion. Size/Enlargement- Bilateral- No enlargement. Discharge- bilateral-None.  Neck Neck- Supple. No Masses.   Chest and Lung Exam Auscultation: Breath Sounds:-Clear even and unlabored.  Cardiovascular Auscultation:Rythm- Regular, rate and rhythm. Murmurs & Other Heart Sounds:Ausculatation of the heart reveal- No Murmurs.  Lymphatic Head & Neck General Head & Neck Lymphatics: Bilateral: Description- No Localized lymphadenopathy.       Assessment & Plan:  With your recent st, enlarged tonsils by exam, and close contract to daughter with strep I will rx augmentin antibiotic. Augmentin has some coverage for sinus infection and bronchitis as well.  For cough rx benzonatate. For nasal congestion rx flonase.  Tylenol for fever.  Follow up in 7 days or as needed  Laporshia Hogen, Percell Miller, [employer]

## 2016-09-04 DIAGNOSIS — J020 Streptococcal pharyngitis: Secondary | ICD-10-CM | POA: Diagnosis not present

## 2016-09-08 MED FILL — ESCITALOPRAM 10 MG TABLET: 10 | 30 days supply | Qty: 30 | Fill #0 | Status: TO

## 2016-09-22 MED FILL — NORETHIND-ETH ESTRAD 1-0.02: 1-20 | 63 days supply | Qty: 63 | Fill #0

## 2016-10-21 MED FILL — ELINEST-28 TABLET: 0.3-30 | 84 days supply | Qty: 84 | Fill #0

## 2017-02-06 MED FILL — ELINEST-28 TABLET: 0.3-30 | 84 days supply | Qty: 84 | Fill #1

## 2017-03-06 ENCOUNTER — Emergency Department (INDEPENDENT_AMBULATORY_CARE_PROVIDER_SITE_OTHER)
Admission: EM | Admit: 2017-03-06 | Discharge: 2017-03-06 | Disposition: A | Discharge status: Home / Self Care | Payer: 59 | Source: Home / Self Care | Attending: Family Medicine | Admitting: Family Medicine

## 2017-03-06 ENCOUNTER — Encounter: Payer: Self-pay | Admitting: Emergency Medicine

## 2017-03-06 ENCOUNTER — Emergency Department (INDEPENDENT_AMBULATORY_CARE_PROVIDER_SITE_OTHER): Payer: 59

## 2017-03-06 DIAGNOSIS — M79671 Pain in right foot: Secondary | ICD-10-CM | POA: Diagnosis not present

## 2017-03-06 DIAGNOSIS — S99921A Unspecified injury of right foot, initial encounter: Secondary | ICD-10-CM | POA: Diagnosis not present

## 2017-03-06 DIAGNOSIS — M25474 Effusion, right foot: Secondary | ICD-10-CM

## 2017-03-06 DIAGNOSIS — M7989 Other specified soft tissue disorders: Secondary | ICD-10-CM | POA: Diagnosis not present

## 2017-03-06 NOTE — ED Triage Notes (Signed)
Pt states she hit her right foot on a chair x2 days ago. Painful and taking motrin.

## 2017-03-06 NOTE — ED Provider Notes (Signed)
Misty [facility]    CSN: [id number] Arrival date & time: 03/06/17  0809     History   Chief Complaint Chief Complaint  Patient presents with  . Foot Pain    HPI Misty Austin is a 31 y.o. female.   HPI Misty Austin is a 31 y.o. female presenting to UC with c/o Right dorsal foot pain for about 2 days, 5/10 with associated swelling to the top of her foot.  Pt states she was walking in the dark when she accidentally hit it on a piece of furniture that had a metal bar under it.  Pain is aching and sore, gradually worsens throughout the day. She has been using ice, acetaminophen and ibuprofen with mild temporary relief.  Hx of fracture in one of her feet in the past. Pt cannot recall which foot, did not require surgery. Pt states the healed fracture was an incidental finding during an x-ray for something unrelated.    Past Medical History:  Diagnosis Date  . Congenital kidney disease    familial nephritis  . GERD (gastroesophageal reflux disease)   . 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 Hx of varicella   . IBS (irritable bowel syndrome)   . Thyroid nodule    benign folliculr on bx 03/4764  . Vaginal Pap smear, abnormal     Patient Active Problem List   Diagnosis Date Noted  . Indication for care in labor and delivery, antepartum 05/12/2015  . NSVD (normal spontaneous vaginal delivery) 01/29/2013  . ABDOMINAL PAIN OTHER SPECIFIED SITE 07/31/2009    Past Surgical History:  Procedure Laterality Date  . LAPAROSCOPIC CHOLECYSTECTOMY  09/2009   thompson  . WISDOM TOOTH EXTRACTION      OB History    Gravida Para Term Preterm AB Living   2 2 2     2    SAB TAB Ectopic Multiple Live Births         0 2       Home Medications    Prior to Admission medications   Medication Sig Start Date End Date Taking? Authorizing Provider  amoxicillin-clavulanate (AUGMENTIN) 875-125 MG tablet Take 1 tablet by mouth 2 (two) times daily.  08/03/16   Saguier, Percell Miller, PA-C  benzonatate (TESSALON) 100 MG capsule Take 1 capsule (100 mg total) by mouth 3 (three) times daily as needed for cough. 08/03/16   Saguier, Percell Miller, PA-C  escitalopram (LEXAPRO) 10 MG tablet Take 10 mg by mouth daily.    [provider]  fluticasone (FLONASE) 50 MCG/ACT nasal spray Place 2 sprays into both nostrils daily. 08/03/16   Saguier, Percell Miller, PA-C  norethindrone (MICRONOR,CAMILA,ERRIN) 0.35 MG tablet Take 1 tablet by mouth daily. 06/25/15   [provider]  ondansetron (ZOFRAN ODT) 4 MG disintegrating tablet 4mg  ODT q4 hours prn nausea/vomit Patient not taking: Reported on 08/03/2016 07/18/16   Malvin Johns, MD    Family History Family History  Problem Relation Age of Onset  . Esophageal cancer Maternal Grandfather   . Diabetes Maternal Grandmother   . Breast cancer Maternal Grandmother   . Lymphoma Paternal Grandfather   . Leukemia Paternal Grandfather   . Diabetes Paternal Grandmother   . Kidney disease Mother        familial nephritis  . Melanoma Mother 64  . Basal cell carcinoma Mother     Social History Social History  Substance Use Topics  . Smoking status: Never Smoker  . Smokeless tobacco: Never Used  .  Alcohol use No     Allergies   Patient has no known allergies.   Review of Systems Review of Systems  Musculoskeletal: Positive for joint swelling and myalgias. Negative for arthralgias and gait problem.  Skin: Negative for color change and wound.  Neurological: Positive for numbness (mild tingling on top of Right foot). Negative for weakness.     Physical Exam Triage Vital Signs ED Triage Vitals  Enc Vitals Group     BP      Pulse      Resp      Temp      Temp src      SpO2      Weight      Height      Head Circumference      Peak Flow      Pain Score      Pain Loc      Pain Edu?      Excl. in [place]?    No data found.   Updated Vital Signs BP 97/64 (BP Location: Right Arm)   Pulse 84    Temp 98.1 F (36.7 C) (Oral)   Wt 146 lb (66.2 kg)   LMP 03/02/2017 (Approximate)   SpO2 100%   BMI 24.30 kg/m   Visual Acuity Right Eye Distance:   Left Eye Distance:   Bilateral Distance:    Right Eye Near:   Left Eye Near:    Bilateral Near:     Physical Exam  Constitutional: She is oriented to person, place, and time. She appears well-developed and well-nourished.  HENT:  Head: Normocephalic and atraumatic.  Eyes: EOM are normal.  Neck: Normal range of motion.  Cardiovascular: Normal rate.   Pulses:      Dorsalis pedis pulses are 2+ on the right side.       Posterior tibial pulses are 2+ on the right side.  Pulmonary/Chest: Effort normal.  Musculoskeletal: Normal range of motion. She exhibits edema and tenderness.  Right foot: mild edema to dorsal aspect, tender. No obvious deformity. Full ROM ankle and toes. No tenderness to ankle or toes.  Calf is soft, non-tender.   Neurological: She is alert and oriented to person, place, and time.  Skin: Skin is warm and dry. Capillary refill takes less than 2 seconds. No erythema.  Right foot: skin in tact. No ecchymosis or erythema.   Psychiatric: She has a normal mood and affect. Her behavior is normal.  Nursing note and vitals reviewed.    UC Treatments / Results  Labs (all labs ordered are listed, but only abnormal results are displayed) Labs Reviewed - No data to display  EKG  EKG Interpretation None       Radiology Dg Foot Complete Right  Result Date: 03/06/2017 CLINICAL DATA:  31 year-old female hit her RIGHT foot on a metal bar x 2 days ago. C/O pain to proximal tarsals with swelling and redness. EXAM: RIGHT FOOT COMPLETE - 3+ VIEW COMPARISON:  None. FINDINGS: There is no evidence of fracture or dislocation. There is no evidence of arthropathy or other focal bone abnormality. Soft tissues are unremarkable. IMPRESSION: Negative. Electronically Signed   By: Lucrezia Europe M.D.   On: 03/06/2017 08:41     Procedures Procedures (including critical care time)  Medications Ordered in UC Medications - No data to display   Initial Impression / Assessment and Plan / UC Course  I have reviewed the triage vital signs and the nursing notes.  Pertinent labs &  imaging results that were available during my care of the patient were reviewed by me and considered in my medical decision making (see chart for details).      Final Clinical Impressions(s) / UC Diagnoses   Final diagnoses:  Right foot pain  Swelling of foot joint, right   Hx and exam c/w soft-tissue injury. Offered Ace wrap, pt declined. Encouraged to continue rest, ice, compression, and elevation at home for comfort. F/u with Sports Medicine in 2-3 weeks if not improving, sooner if worsening.   New Prescriptions New Prescriptions   No medications on file     Controlled Substance Prescriptions [place] Controlled Substance Registry consulted? Not Applicable   Tyrell Antonio 03/06/17 3612

## 2017-04-21 MED FILL — ELINEST-28 TABLET: 0.3-30 | 28 days supply | Qty: 28 | Fill #2

## 2017-06-02 MED FILL — ESCITALOPRAM 10 MG TABLET: 10 | 30 days supply | Qty: 30 | Fill #0

## 2017-06-02 MED FILL — ELINEST-28 TABLET: 0.3-30 | 28 days supply | Qty: 28 | Fill #0

## 2017-06-08 ENCOUNTER — Telehealth: Payer: 59 | Admitting: Physician Assistant

## 2017-06-08 DIAGNOSIS — B9689 Other specified bacterial agents as the cause of diseases classified elsewhere: Secondary | ICD-10-CM

## 2017-06-08 DIAGNOSIS — J019 Acute sinusitis, unspecified: Secondary | ICD-10-CM | POA: Diagnosis not present

## 2017-06-08 MED ORDER — AMOXICILLIN-POT CLAVULANATE 875-125 MG PO TABS: 1.0000 | ORAL_TABLET | Freq: Two times a day (BID) | ORAL | 0 refills | Status: DC

## 2017-06-08 MED FILL — AMOX-CLAV 875-125 MG TABLET: 875-125 | 7 days supply | Qty: 14 | Fill #0

## 2017-06-08 NOTE — Progress Notes (Signed)

## 2017-06-09 ENCOUNTER — Telehealth: Payer: 59 | Admitting: Family

## 2017-06-09 ENCOUNTER — Ambulatory Visit (INDEPENDENT_AMBULATORY_CARE_PROVIDER_SITE_OTHER): Payer: 59 | Admitting: Medical

## 2017-06-09 ENCOUNTER — Encounter: Payer: Self-pay | Admitting: Medical

## 2017-06-09 VITALS — BP 102/66 | HR 67 | Temp 97.9°F | Resp 16 | Wt 148.0 lb

## 2017-06-09 DIAGNOSIS — H5711 Ocular pain, right eye: Secondary | ICD-10-CM

## 2017-06-09 DIAGNOSIS — J329 Chronic sinusitis, unspecified: Secondary | ICD-10-CM

## 2017-06-09 DIAGNOSIS — H5203 Hypermetropia, bilateral: Secondary | ICD-10-CM | POA: Diagnosis not present

## 2017-06-09 DIAGNOSIS — H5710 Ocular pain, unspecified eye: Secondary | ICD-10-CM

## 2017-06-09 DIAGNOSIS — H10011 Acute follicular conjunctivitis, right eye: Secondary | ICD-10-CM | POA: Diagnosis not present
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
## 2017-06-09 NOTE — Progress Notes (Signed)
Subjective:    Patient ID: Misty Austin, female    DOB: 1986-05-22, 31 y.o.   MRN: [id number]  HPI  Pt in today with some rt eye redness with some pain. She had e-visit yesterday and dx with sinus infection. She was given augmentin. Pt states her eye is more than it was yesterday. Severity of redness waxes and wanes. Pt discomfort is 4/10 with little pressure and slight burn. No trauma reported. No light flashes.    No dc to eye. 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  2 weeks sinus pressure recent nasal drainage colored. Pt did e visit and got augmentin. Just started.   Review of Systems  Constitutional: Negative for chills, fatigue and fever.  HENT: Positive for congestion, sinus pressure and sinus pain.   Eyes: Positive for pain and redness. Negative for photophobia, discharge and itching.  Respiratory: Negative for cough, chest tightness, shortness of breath and wheezing.   Cardiovascular: Negative for chest pain.  Gastrointestinal: Negative for abdominal pain and anal bleeding.  Musculoskeletal: Negative for back pain.  Skin: Negative for rash.  Neurological: Negative for dizziness, speech difficulty, weakness, numbness and headaches.  Hematological: Negative for adenopathy. Does not bruise/bleed easily.  Psychiatric/Behavioral: Negative for behavioral problems and confusion. The patient is not nervous/anxious.    Past Medical History:  Diagnosis Date  . Congenital kidney disease    familial nephritis  . GERD (gastroesophageal reflux disease)   . 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 Hx of varicella   . IBS (irritable bowel syndrome)   . Thyroid nodule    benign folliculr on bx 11/4560  . Vaginal Pap smear, abnormal      Social History   Socioeconomic History  . Marital status: Married    Spouse name: Not on file  . Number of children: 0  . Years of education: Not on file  . Highest education level: Not on file  Social Needs  . Financial  resource strain: Not on file  . Food insecurity - worry: Not on file  . Food insecurity - inability: Not on file  . Transportation needs - medical: Not on file  . Transportation needs - non-medical: Not on file  Occupational History  . Occupation: Optician, dispensing: [employer]  Tobacco Use  . Smoking status: Never Smoker  . Smokeless tobacco: Never Used  Substance and Sexual Activity  . Alcohol use: No  . Drug use: No  . Sexual activity: Yes  Other Topics Concern  . 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 LAPAROSCOPIC CHOLECYSTECTOMY  09/2009   [facility]  . WISDOM TOOTH EXTRACTION      Family History  Problem Relation Age of Onset  . Esophageal cancer Maternal Grandfather   . Diabetes Maternal Grandmother   . Breast cancer Maternal Grandmother   . Lymphoma Paternal Grandfather   . Leukemia Paternal Grandfather   . Diabetes Paternal Grandmother   . Kidney disease Mother        familial nephritis  . Melanoma Mother 39  . Basal cell carcinoma Mother     No Known Allergies  Current Outpatient Medications on File Prior to Visit  Medication Sig Dispense Refill  . amoxicillin-clavulanate (AUGMENTIN) 875-125 MG tablet Take 1 tablet by mouth 2 (two)  times daily. 14 tablet 0  . benzonatate (TESSALON) 100 MG capsule Take 1 capsule (100 mg total) by mouth 3 (three) times daily as needed for cough. 21 capsule 0  . escitalopram (LEXAPRO) 10 MG tablet Take 10 mg by mouth daily.    . fluticasone (FLONASE) 50 MCG/ACT nasal spray Place 2 sprays into both nostrils daily. 16 g 1  . norethindrone (MICRONOR,CAMILA,ERRIN) 0.35 MG tablet Take 1 tablet by mouth daily.    . ondansetron (ZOFRAN ODT) 4 MG disintegrating tablet 4mg  ODT q4 hours prn nausea/vomit 4 tablet 0   No current facility-administered medications on file prior to visit.     BP 102/66    Pulse 67   Temp 97.9 F (36.6 C) (Oral)   Resp 16   Wt 148 lb (67.1 kg)   SpO2 100%   BMI 24.63 kg/m       Objective:   Physical Exam  General  Mental Status - Alert. General Appearance - Well groomed. Not in acute distress.  Skin Rashes- No Rashes.  HEENT Head- Normal. Ear Auditory Canal - Left- Normal. Right - Normal.Tympanic Membrane- Left- Normal. Right- Normal. Eye Sclera/Conjunctiva- Left- Normal. Right-red and injected conjunctiva.  No yellow discharge.  Ophthalmoscope exam was difficult.  Nondilated and battery on scope was low.  No significant findings. Nose & Sinuses Nasal Mucosa- Left-  Boggy and Congested. Right-  Boggy and  Congested.Bilateral  No maxillary and no frontal sinus pressure. Mouth & Throat Lips: Upper Lip- Normal: no dryness, cracking, pallor, cyanosis, or vesicular eruption. Lower Lip-Normal: no dryness, cracking, pallor, cyanosis or vesicular eruption. Buccal Mucosa- Bilateral- No Aphthous ulcers. Oropharynx- No Discharge or Erythema. Tonsils: Characteristics- Bilateral- No Erythema or Congestion. Size/Enlargement- Bilateral- No enlargement. Discharge- bilateral-None.  Neck Neck- Supple. No Masses.   Chest and Lung Exam Auscultation: Breath Sounds:-Clear even and unlabored.  Cardiovascular Auscultation:Rythm- Regular, rate and rhythm. Murmurs & Other Heart Sounds:Ausculatation of the heart reveal- No Murmurs.  Lymphatic Head & Neck General Head & Neck Lymphatics: Bilateral: Description- No Localized lymphadenopathy.       Assessment & Plan:  For your recent probable sinus infection continue the Augmentin.  Your eye pain could just be early conjunctivitis.  However your lack of discharge combined with moderate eye pressure and burning sensation causes concern for other potential diagnoses.  Approaching the weekend and local medical offices might be closed on Monday in 2 days with upcoming storm.  I do think it is best to be  evaluated now with dilated eye exam.  My nondilated exam did not reveal much.  Follow-up with [place] as needed if condition persists despite above treatment and optometrist evaluation.  Avalene Sealy, Percell Miller, PA-C

## 2017-06-09 NOTE — Progress Notes (Signed)
Thank you for the details you included in the comment boxes. Those details are very helpful in determining the best course of treatment for you and help [place] to provide the best care. Given the lack of drainage, we need to examine you in person with special equipment.  Based on what you shared with me it looks like you have a serious condition that should be evaluated in a face to face office visit.  NOTE: Even if you have entered your credit card information for this eVisit, you will not be charged.   If you are having a true medical emergency please call 911.  If you need an urgent face to face visit, [place] has four urgent care centers for your convenience.  If you need care fast and have a high deductible or no insurance consider:   [email or web address]  [phone]  [address], [address] [place], [place] [address] 8 am to 8 pm Monday-Friday 10 am to 4 pm Saturday-Sunday   The following sites will take your  insurance:    . [facility] a Provider at this Location  [address] [place], [place] [address] . 10 am to 8 pm Monday-Friday . 12 pm to 8 pm Saturday-Sunday   . [facility] [place] a Provider at this Location  [address] [place], [place], [place] [address] . 8 am to 8 pm Monday-Friday . 9 am to 6 pm Saturday . 11 am to 6 pm Sunday   . [facility] [place] Get Driving Directions  [address] [place], [place] [address] . 8 am to 8 pm Monday-Friday . 8 am to 4 pm Saturday-Sunday   Your e-visit answers were reviewed by a board certified advanced clinical practitioner to complete your personal care plan.  Thank you for using e-Visits.

## 2017-06-23 DIAGNOSIS — F331 Major depressive disorder, recurrent, moderate: Secondary | ICD-10-CM | POA: Diagnosis not present

## 2017-07-11 MED FILL — ESCITALOPRAM 10 MG TABLET: 10 | 30 days supply | Qty: 30 | Fill #1

## 2017-07-14 DIAGNOSIS — F331 Major depressive disorder, recurrent, moderate: Secondary | ICD-10-CM | POA: Diagnosis not present

## 2017-07-15 DIAGNOSIS — H9203 Otalgia, bilateral: Secondary | ICD-10-CM | POA: Diagnosis not present

## 2017-08-07 DIAGNOSIS — F331 Major depressive disorder, recurrent, moderate: Secondary | ICD-10-CM | POA: Diagnosis not present

## 2017-08-22 DIAGNOSIS — F331 Major depressive disorder, recurrent, moderate: Secondary | ICD-10-CM | POA: Diagnosis not present

## 2017-08-23 DIAGNOSIS — Z130 Encounter for screening for diseases of the blood and blood-forming organs and certain disorders involving the immune mechanism: Secondary | ICD-10-CM | POA: Diagnosis not present

## 2017-08-23 DIAGNOSIS — Z1151 Encounter for screening for human papillomavirus (HPV): Secondary | ICD-10-CM | POA: Diagnosis not present

## 2017-08-23 DIAGNOSIS — Z3041 Encounter for surveillance of contraceptive pills: Secondary | ICD-10-CM | POA: Diagnosis not present

## 2017-08-23 DIAGNOSIS — Z6825 Body mass index (BMI) 25.0-25.9, adult: Secondary | ICD-10-CM | POA: Diagnosis not present

## 2017-08-23 DIAGNOSIS — F329 Major depressive disorder, single episode, unspecified: Secondary | ICD-10-CM | POA: Diagnosis not present

## 2017-08-23 DIAGNOSIS — Z0000 Encounter for general adult medical examination without abnormal findings: Secondary | ICD-10-CM | POA: Diagnosis not present

## 2017-08-23 DIAGNOSIS — Z01419 Encounter for gynecological examination (general) (routine) without abnormal findings: Secondary | ICD-10-CM | POA: Diagnosis not present

## 2017-08-23 DIAGNOSIS — Z124 Encounter for screening for malignant neoplasm of cervix: Secondary | ICD-10-CM | POA: Diagnosis not present

## 2017-08-23 DIAGNOSIS — Z1389 Encounter for screening for other disorder: Secondary | ICD-10-CM | POA: Diagnosis not present

## 2017-08-23 MED FILL — NORETHIND-ETH ESTRAD 1-0.02: 1-20 | 84 days supply | Qty: 84 | Fill #0

## 2017-08-23 MED FILL — ESCITALOPRAM 10 MG TABLET: 10 | 90 days supply | Qty: 90 | Fill #0

## 2017-09-07 DIAGNOSIS — F331 Major depressive disorder, recurrent, moderate: Secondary | ICD-10-CM | POA: Diagnosis not present

## 2017-09-09 ENCOUNTER — Telehealth: Payer: 59 | Admitting: Family

## 2017-09-09 DIAGNOSIS — B9689 Other specified bacterial agents as the cause of diseases classified elsewhere: Secondary | ICD-10-CM | POA: Diagnosis not present

## 2017-09-09 DIAGNOSIS — J028 Acute pharyngitis due to other specified organisms: Secondary | ICD-10-CM | POA: Diagnosis not present

## 2017-09-09 MED ORDER — BENZONATATE 100 MG PO CAPS: 100.0000 mg | ORAL_CAPSULE | Freq: Three times a day (TID) | ORAL | 0 refills | Status: DC | PRN

## 2017-09-09 MED ORDER — AZITHROMYCIN 250 MG PO TABS: ORAL_TABLET | ORAL | 0 refills | Status: DC

## 2017-09-09 NOTE — Progress Notes (Signed)

## 2017-09-16 ENCOUNTER — Telehealth: Payer: 59 | Admitting: Nurse Practitioner

## 2017-09-16 DIAGNOSIS — R059 Cough, unspecified: Secondary | ICD-10-CM

## 2017-09-16 DIAGNOSIS — R509 Fever, unspecified: Secondary | ICD-10-CM | POA: Diagnosis not present

## 2017-09-16 DIAGNOSIS — R05 Cough: Secondary | ICD-10-CM | POA: Diagnosis not present

## 2017-09-16 MED ORDER — DOXYCYCLINE HYCLATE 100 MG PO TABS: 100.0000 mg | ORAL_TABLET | Freq: Two times a day (BID) | ORAL | 0 refills | Status: DC

## 2017-09-16 NOTE — Progress Notes (Signed)
We are sorry that you are not feeling well.  Here is how we plan to help!  Based on your presentation I believe you most likely have A cough due to bacteria.  When patients have a fever and a productive cough with a change in color or increased sputum production, we are concerned about bacterial bronchitis.  If left untreated it can progress to pneumonia.  If your symptoms do not improve with your treatment plan it is important that you contact your provider.   I have prescribed Doxycycline 100 mg twice a day for 7 days     In addition you may use A non-prescription cough medication called Mucinex DM: take 2 tablets every 12 hours.   From your responses in the eVisit questionnaire you describe inflammation in the upper respiratory tract which is causing a significant cough.  This is commonly called Bronchitis and has four common causes:    Allergies  Viral Infections  Acid Reflux  Bacterial Infection Allergies, viruses and acid reflux are treated by controlling symptoms or eliminating the cause. An example might be a cough caused by taking certain blood pressure medications. You stop the cough by changing the medication. Another example might be a cough caused by acid reflux. Controlling the reflux helps control the cough.  USE OF BRONCHODILATOR ("RESCUE") INHALERS: There is a risk from using your bronchodilator too frequently.  The risk is that over-reliance on a medication which only relaxes the muscles surrounding the breathing tubes can reduce the effectiveness of medications prescribed to reduce swelling and congestion of the tubes themselves.  Although you feel brief relief from the bronchodilator inhaler, your asthma may actually be worsening with the tubes becoming more swollen and filled with mucus.  This can delay other crucial treatments, such as oral steroid medications. If you need to use a bronchodilator inhaler daily, several times per day, you should discuss this with your  provider.  There are probably better treatments that could be used to keep your asthma under control.     HOME CARE . Only take medications as instructed by your medical team. . Complete the entire course of an antibiotic. . Drink plenty of fluids and get plenty of rest. . Avoid close contacts especially the very young and the elderly . Cover your mouth if you cough or cough into your sleeve. . Always remember to wash your hands . A steam or ultrasonic humidifier can help congestion.   GET HELP RIGHT AWAY IF: . You develop worsening fever. . You become short of breath . You cough up blood. . Your symptoms persist after you have completed your treatment plan MAKE SURE YOU   Understand these instructions.  Will watch your condition.  Will get help right away if you are not doing well or get worse.  Your e-visit answers were reviewed by a board certified advanced clinical practitioner to complete your personal care plan.  Depending on the condition, your plan could have included both over the counter or prescription medications. If there is a problem please reply  once you have received a response from your provider. Your safety is important to us.  If you have drug allergies check your prescription carefully.    You can use MyChart to ask questions about today's visit, request a non-urgent call back, or ask for a work or school excuse for 24 hours related to this e-Visit. If it has been greater than 24 hours you will need to follow up with your provider,   or enter a new e-Visit to address those concerns. You will get an e-mail in the next two days asking about your experience.  I hope that your e-visit has been valuable and will speed your recovery. Thank you for using e-visits.   

## 2017-09-20 MED FILL — ELINEST-28 TABLET: 0.3-30 | 84 days supply | Qty: 84 | Fill #0

## 2017-10-09 DIAGNOSIS — F331 Major depressive disorder, recurrent, moderate: Secondary | ICD-10-CM | POA: Diagnosis not present

## 2017-11-02 DIAGNOSIS — F331 Major depressive disorder, recurrent, moderate: Secondary | ICD-10-CM | POA: Diagnosis not present

## 2017-11-17 ENCOUNTER — Telehealth: Payer: 59 | Admitting: Nurse Practitioner

## 2017-11-17 DIAGNOSIS — R05 Cough: Secondary | ICD-10-CM | POA: Diagnosis not present

## 2017-11-17 DIAGNOSIS — R059 Cough, unspecified: Secondary | ICD-10-CM

## 2017-11-17 MED ORDER — AZITHROMYCIN 250 MG PO TABS: ORAL_TABLET | ORAL | 0 refills | Status: DC

## 2017-11-17 NOTE — Progress Notes (Signed)

## 2017-11-30 DIAGNOSIS — F331 Major depressive disorder, recurrent, moderate: Secondary | ICD-10-CM | POA: Diagnosis not present

## 2017-12-14 MED FILL — ELINEST-28 TABLET: 0.3-30 | 84 days supply | Qty: 84 | Fill #1 | Status: TO

## 2017-12-22 MED FILL — ESCITALOPRAM 10 MG TABLET: 10 | 90 days supply | Qty: 90 | Fill #1

## 2018-01-22 DIAGNOSIS — H5203 Hypermetropia, bilateral: Secondary | ICD-10-CM | POA: Diagnosis not present

## 2018-02-11 ENCOUNTER — Other Ambulatory Visit: Payer: Self-pay

## 2018-02-11 ENCOUNTER — Emergency Department (INDEPENDENT_AMBULATORY_CARE_PROVIDER_SITE_OTHER)
Admission: EM | Admit: 2018-02-11 | Discharge: 2018-02-11 | Disposition: A | Discharge status: Home / Self Care | Payer: 59 | Source: Home / Self Care | Attending: Family Medicine | Admitting: Family Medicine

## 2018-02-11 ENCOUNTER — Encounter: Payer: Self-pay | Admitting: Emergency Medicine

## 2018-02-11 DIAGNOSIS — R110 Nausea: Secondary | ICD-10-CM

## 2018-02-11 DIAGNOSIS — R519 Headache, unspecified: Secondary | ICD-10-CM

## 2018-02-11 DIAGNOSIS — R51 Headache: Secondary | ICD-10-CM

## 2018-02-11 HISTORY — DX: Anxiety disorder, unspecified: F41.9

## 2018-02-11 HISTORY — DX: Major depressive disorder, single episode, unspecified: F32.9

## 2018-02-11 HISTORY — DX: Depression, unspecified: F32.A

## 2018-02-11 MED ORDER — METHOCARBAMOL 500 MG PO TABS: 500.0000 mg | ORAL_TABLET | Freq: Two times a day (BID) | ORAL | 0 refills | Status: DC

## 2018-02-11 MED ORDER — METOCLOPRAMIDE HCL 5 MG/ML IJ SOLN
5.0000 mg | Freq: Once | INTRAMUSCULAR | Status: AC
  Administered 2018-02-11: 5 mg via INTRAMUSCULAR

## 2018-02-11 MED ORDER — PREDNISONE 20 MG PO TABS: ORAL_TABLET | ORAL | 0 refills | Status: DC

## 2018-02-11 MED ORDER — ONDANSETRON HCL 4 MG PO TABS: 4.0000 mg | ORAL_TABLET | Freq: Four times a day (QID) | ORAL | 0 refills | Status: DC

## 2018-02-11 MED ORDER — DEXAMETHASONE SODIUM PHOSPHATE 10 MG/ML IJ SOLN
10.0000 mg | Freq: Once | INTRAMUSCULAR | Status: AC
  Administered 2018-02-11: 10 mg via INTRAMUSCULAR

## 2018-02-11 NOTE — ED Triage Notes (Signed)
Patient reports headache for 5 days; some nausea; sharper pains occasionally shooting right side of head.Mother has had migraines but patient has not.

## 2018-02-11 NOTE — ED Provider Notes (Addendum)
[facility]    CSN: [id number] Arrival date & time: 02/11/18  1104     History   Chief Complaint Chief Complaint  Patient presents with  . Headache  . Nausea    HPI Misty Austin is a 32 y.o. female.   HPI  Misty Austin is a 32 y.o. female presenting to UC with c/o frontal and Right sided HA that has been waxing and waning for about 5 days.  Mild nausea but no vomiting.  Occasional sharp pain on Right side and bilateral jaw pain.  She has a bite guard from 10 years ago that she has tried this week but no relief of HA.  She has tried ibuprofen with no relief so she took 2 Excedrin Migraine around 7AM but no relief. Mother was dx with migraines when she was in 6th grade but pt has never had migraines. No increased stress or recent illness. No cough, congestion or sinus pain. No fever, chills, rashes, or neck pain.   Past Medical History:  Diagnosis Date  . Anxiety and depression   . Congenital kidney disease    familial nephritis  . GERD (gastroesophageal reflux disease)   . 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 Hx of varicella   . IBS (irritable bowel syndrome)   . Thyroid nodule    benign folliculr on bx 09/8248  . Vaginal Pap smear, abnormal     Patient Active Problem List   Diagnosis Date Noted  . Indication for care in labor and delivery, antepartum 05/12/2015  . NSVD (normal spontaneous vaginal delivery) 01/29/2013  . ABDOMINAL PAIN OTHER SPECIFIED SITE 07/31/2009    Past Surgical History:  Procedure Laterality Date  . LAPAROSCOPIC CHOLECYSTECTOMY  09/2009   [facility]ompson  . WISDOM TOOTH EXTRACTION      OB History    Gravida  2   Para  2   Term  2   Preterm      AB      Living  2     SAB      TAB      Ectopic      Multiple  0   Live Births  2            Home Medications    Prior to Admission medications   Medication Sig Start Date End Date Taking? Authorizing Provider    amoxicillin-clavulanate (AUGMENTIN) 875-125 MG tablet Take 1 tablet by mouth 2 (two) times daily. 06/08/17   Brunetta Jeans, PA-C  azithromycin (ZITHROMAX Z-PAK) 250 MG tablet As directed 11/17/17   Chevis Pretty, FNP  azithromycin (ZITHROMAX) 250 MG tablet Take 2 tabs now then 1 daily times 4 days 09/09/17   Withrow, Elyse Jarvis, FNP  benzonatate (TESSALON PERLES) 100 MG capsule Take 1-2 capsules (100-200 mg total) by mouth every 8 (eight) hours as needed for cough. 09/09/17   Withrow, Elyse Jarvis, FNP  benzonatate (TESSALON) 100 MG capsule Take 1 capsule (100 mg total) by mouth 3 (three) times daily as needed for cough. 08/03/16   Saguier, Percell Miller, PA-C  doxycycline (VIBRA-TABS) 100 MG tablet Take 1 tablet (100 mg total) by mouth 2 (two) times daily. 1 po bid 09/16/17   Hassell Done, Mary-Margaret, FNP  escitalopram (LEXAPRO) 10 MG tablet Take 10 mg by mouth daily.    [provider]  fluticasone (FLONASE) 50 MCG/ACT nasal spray Place 2 sprays into both nostrils daily. 08/03/16   Saguier, Percell Miller, PA-C  methocarbamol (  ROBAXIN) 500 MG tablet Take 1 tablet (500 mg total) by mouth 2 (two) times daily. 02/11/18   Noe Gens, PA-C  norethindrone (MICRONOR,CAMILA,ERRIN) 0.35 MG tablet Take 1 tablet by mouth daily. 06/25/15   [provider]  ondansetron (ZOFRAN ODT) 4 MG disintegrating tablet 4mg  ODT q4 hours prn nausea/vomit 07/18/16   Malvin Johns, MD  ondansetron (ZOFRAN) 4 MG tablet Take 1 tablet (4 mg total) by mouth every 6 (six) hours. 02/11/18   Noe Gens, PA-C  predniSONE (DELTASONE) 20 MG tablet 3 tabs po day one, then 2 po daily x 4 days 02/11/18   Noe Gens, PA-C    Family History Family History  Problem Relation Age of Onset  . Esophageal cancer Maternal Grandfather   . Diabetes Maternal Grandmother   . Breast cancer Maternal Grandmother   . Lymphoma Paternal Grandfather   . Leukemia Paternal Grandfather   . Diabetes Paternal Grandmother   . Kidney disease Mother         familial nephritis  . Melanoma Mother 65  . Basal cell carcinoma Mother     Social History Social History   Tobacco Use  . Smoking status: Never Smoker  . Smokeless tobacco: Never Used  Substance Use Topics  . Alcohol use: No  . Drug use: No     Allergies   Patient has no known allergies.   Review of Systems Review of Systems  Eyes: Positive for photophobia (mild). Negative for visual disturbance.  Gastrointestinal: Positive for nausea. Negative for diarrhea and vomiting.  Musculoskeletal: Negative for neck pain and neck stiffness.  Skin: Negative for rash.  Neurological: Positive for headaches.     Physical Exam Triage Vital Signs ED Triage Vitals  Enc Vitals Group     BP 02/11/18 1120 111/72     Pulse Rate 02/11/18 1120 78     Resp 02/11/18 1120 16     Temp 02/11/18 1120 98.1 F (36.7 C)     Temp Source 02/11/18 1120 Oral     SpO2 02/11/18 1120 99 %     Weight 02/11/18 1121 150 lb (68 kg)     Height 02/11/18 1121 5\' 5"  (1.651 m)     Head Circumference --      Peak Flow --      Pain Score 02/11/18 1121 4     Pain Loc --      Pain Edu? --      Excl. in [place]? --    No data found.  Updated Vital Signs BP 111/72 (BP Location: Right Arm)   Pulse 78   Temp 98.1 F (36.7 C) (Oral)   Resp 16   Ht 5\' 5"  (1.651 m)   Wt 150 lb (68 kg)   LMP 02/10/2018 (Exact Date)   SpO2 99%   BMI 24.96 kg/m   Visual Acuity Right Eye Distance:   Left Eye Distance:   Bilateral Distance:    Right Eye Near:   Left Eye Near:    Bilateral Near:     Physical Exam  Constitutional: She is oriented to person, place, and time. She appears well-developed and well-nourished.  Non-toxic appearance. She does not appear ill. No distress.  HENT:  Head: Normocephalic and atraumatic.  Right Ear: Tympanic membrane normal.  Left Ear: Tympanic membrane normal.  Nose: Nose normal. Right sinus exhibits no maxillary sinus tenderness and no frontal sinus tenderness. Left sinus exhibits  no maxillary sinus tenderness and no frontal sinus tenderness.  Mouth/Throat:  Uvula is midline, oropharynx is clear and moist and mucous membranes are normal.  No tenderness to TMJs or crepitus. No tenderness to temporal artery  Eyes: Pupils are equal, round, and reactive to light. EOM are normal. Right eye exhibits no nystagmus. Left eye exhibits no nystagmus.  Neck: Normal range of motion. Neck supple.  No nuchal rigidity or meningeal signs.  Cardiovascular: Normal rate and regular rhythm.  Pulmonary/Chest: Effort normal and breath sounds normal.  Musculoskeletal: Normal range of motion.  Neurological: She is alert and oriented to person, place, and time. She has normal strength. She displays a negative Romberg sign. GCS eye subscore is 4. GCS verbal subscore is 5. GCS motor subscore is 6.  Skin: Skin is warm and dry. Capillary refill takes less than 2 seconds.  Psychiatric: She has a normal mood and affect. Her behavior is normal.  Nursing note and vitals reviewed.    UC Treatments / Results  Labs (all labs ordered are listed, but only abnormal results are displayed) Labs Reviewed - No data to display  EKG None  Radiology No results found.  Procedures Procedures (including critical care time)  Medications Ordered in UC Medications  dexamethasone (DECADRON) injection 10 mg (10 mg Intramuscular Given 02/11/18 1143)  metoCLOPramide (REGLAN) injection 5 mg (5 mg Intramuscular Given 02/11/18 1143)    Initial Impression / Assessment and Plan / UC Course  I have reviewed the triage vital signs and the nursing notes.  Pertinent labs & imaging results that were available during my care of the patient were reviewed by me and considered in my medical decision making (see chart for details).     Doubt CVA, SAH, or meningitis No evidence of emergency process taking place at this time. No relief with Reglan or Decadron. Pain 4/10 Normal neuro exam  Encouraged pt to take oral  medications as prescribed and f/u with PCP and dentist this week as jaw pain/clinching could be the cause of new HA.    Final Clinical Impressions(s) / UC Diagnoses   Final diagnoses:  Bad headache  Nausea without vomiting     Discharge Instructions      You may take the prednisone tomorrow with breakfast to help with your headache and muscle pain from inflammation.  Robaxin (methocarbamol) is a muscle relaxer and may cause drowsiness. Do not drink alcohol, drive, or operate heavy machinery while taking.  You may take 500mg  acetaminophen every 4-6 hours or in combination with ibuprofen 400-600mg  every 6-8 hours as needed for pain and inflammation.  Please schedule a follow up appointment with family medicine and your dentist this week for further evaluation and treatment of your persistent headache.  Please call 911 or go to the hospital if symptoms worsening- severe headache, dizziness, passing out, change in vision.      ED Prescriptions    Medication Sig Dispense Auth. Provider   ondansetron (ZOFRAN) 4 MG tablet Take 1 tablet (4 mg total) by mouth every 6 (six) hours. 12 tablet Gerarda Fraction, Leva Baine O, PA-C   methocarbamol (ROBAXIN) 500 MG tablet Take 1 tablet (500 mg total) by mouth 2 (two) times daily. 20 tablet Gerarda Fraction, Zakarie Sturdivant O, PA-C   predniSONE (DELTASONE) 20 MG tablet 3 tabs po day one, then 2 po daily x 4 days 11 tablet Noe Gens, PA-C     Controlled Substance Prescriptions [place] Controlled Substance Registry consulted? Not Applicable      Tyrell Antonio 02/12/18 1154

## 2018-02-11 NOTE — Discharge Instructions (Signed)
°  You may take the prednisone tomorrow with breakfast to help with your headache and muscle pain from inflammation.  Robaxin (methocarbamol) is a muscle relaxer and may cause drowsiness. Do not drink alcohol, drive, or operate heavy machinery while taking.  You may take 500mg  acetaminophen every 4-6 hours or in combination with ibuprofen 400-600mg  every 6-8 hours as needed for pain and inflammation.  Please schedule a follow up appointment with family medicine and your dentist this week for further evaluation and treatment of your persistent headache.  Please call 911 or go to the hospital if symptoms worsening- severe headache, dizziness, passing out, change in vision.

## 2018-02-12 ENCOUNTER — Ambulatory Visit (INDEPENDENT_AMBULATORY_CARE_PROVIDER_SITE_OTHER): Payer: 59 | Admitting: Family

## 2018-02-12 ENCOUNTER — Encounter: Payer: Self-pay | Admitting: Family

## 2018-02-12 VITALS — BP 108/72 | HR 76 | Temp 98.3°F | Ht 65.0 in | Wt 156.2 lb

## 2018-02-12 DIAGNOSIS — R51 Headache: Secondary | ICD-10-CM | POA: Diagnosis not present

## 2018-02-12 DIAGNOSIS — R519 Headache, unspecified: Secondary | ICD-10-CM

## 2018-02-12 MED ORDER — KETOROLAC TROMETHAMINE 30 MG/ML IJ SOLN
30.0000 mg | Freq: Once | INTRAMUSCULAR | Status: AC
  Administered 2018-02-12: 30 mg via INTRAMUSCULAR

## 2018-02-12 NOTE — Progress Notes (Signed)
Misty Austin is a 32 y.o. female with the following history as recorded in Epic[facility]:  Patient Active Problem List   Diagnosis Date Noted  . Indication for care in labor and delivery, antepartum 05/12/2015  . NSVD (normal spontaneous vaginal delivery) 01/29/2013  . ABDOMINAL PAIN OTHER SPECIFIED SITE 07/31/2009    Current Outpatient Medications  Medication Sig Dispense Refill  . escitalopram (LEXAPRO) 10 MG tablet Take 10 mg by mouth daily.    . norgestrel-ethinyl estradiol (LO/OVRAL,CRYSELLE) 0.3-30 MG-MCG tablet Take 1 tablet by mouth daily.     Current Facility-Administered Medications  Medication Dose Route Frequency Provider Last Rate Last Dose  . ketorolac (TORADOL) 30 MG/ML injection 30 mg  30 mg Intramuscular Once Marrian Salvage, FNP        Allergies: Patient has no known allergies.  Past Medical History:  Diagnosis Date  . Anxiety and depression   . Congenital kidney disease    familial nephritis  . GERD (gastroesophageal reflux disease)   . 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 Hx of varicella   . IBS (irritable bowel syndrome)   . Thyroid nodule    benign folliculr on bx 09/5463  . Vaginal Pap smear, abnormal     Past Surgical History:  Procedure Laterality Date  . LAPAROSCOPIC CHOLECYSTECTOMY  09/2009   [facility]  . WISDOM TOOTH EXTRACTION      Family History  Problem Relation Age of Onset  . Esophageal cancer Maternal Grandfather   . Diabetes Maternal Grandmother   . Breast cancer Maternal Grandmother   . Lymphoma Paternal Grandfather   . Leukemia Paternal Grandfather   . Diabetes Paternal Grandmother   . Kidney disease Mother        familial nephritis  . Melanoma Mother 51  . Basal cell carcinoma Mother     Social History   Tobacco Use  . Smoking status: Never Smoker  . Smokeless tobacco: Never Used  Substance Use Topics  . Alcohol use: No    Subjective:   Patient presents today with concerns for 6 day  history of headache; notes that headache was so severe when it first started she threw up from the pain; has been self-medicating with OTC Advil, Aleve, Excedrin; headache mostly localized over right side of head; Works on computer 5 x per day; no personal history of migraines but mother does have history of migraines;  LMP- now; on Lo-ovral; was due to start new pack yesterday;  Sees GYN regularly for preventive health care needs; labs were checked in February and normal;   Went to [facility] yesterday with the headache; was treated with 2 shots- Reglan and Decadron; no relief; thinks that Robaxin actually made her feel worse today;   Objective:  Vitals:   02/12/18 1346  BP: 108/72  Pulse: 76  Temp: 98.3 F (36.8 C)  TempSrc: Oral  SpO2: 98%  Weight: 156 lb 3.2 oz (70.9 kg)  Height: 5\' 5"  (1.651 m)    General: Well developed, well nourished, in no acute distress  Skin : Warm and dry.  Head: Normocephalic and atraumatic  Eyes: Sclera and conjunctiva clear; pupils round and reactive to light; extraocular movements intact  Ears: External normal; canals clear; tympanic membranes normal  Oropharynx: Pink, supple. No suspicious lesions  Neck: Supple without thyromegaly, adenopathy  Lungs: Respirations unlabored; clear to auscultation bilaterally without wheeze, rales, rhonchi  CVS exam: normal rate and regular rhythm.  Vessels: Symmetric bilaterally  Neurologic: Alert and  oriented; speech intact; face symmetrical; moves all extremities well; CNII-XII intact without focal deficit   Assessment:  1. Acute nonintractable headache, unspecified headache type     Plan:  Suspect menstrual migraine; will give Toradol IM 30 mg; if pain present in 24-48 hours, she is to start oral prednisone and will need to consider head CT; she is to keep a headache journal and bring to next OV in October 2019; May need to consider alternative contraceptive options if headaches are persisting- continual cycling/ IUD?   She is to call back in 24-48 hours to update her response to Toradol.    No follow-ups on file.  No orders of the defined types were placed in this encounter.   Requested Prescriptions    No prescriptions requested or ordered in this encounter

## 2018-02-12 NOTE — Patient Instructions (Signed)

## 2018-02-13 ENCOUNTER — Encounter: Payer: Self-pay | Admitting: Family

## 2018-02-14 DIAGNOSIS — Z86018 Personal history of other benign neoplasm: Secondary | ICD-10-CM | POA: Diagnosis not present

## 2018-02-14 DIAGNOSIS — D1800 Hemangioma unspecified site: Secondary | ICD-10-CM | POA: Diagnosis not present

## 2018-02-14 DIAGNOSIS — Z808 Family history of malignant neoplasm of other organs or systems: Secondary | ICD-10-CM | POA: Diagnosis not present

## 2018-02-14 DIAGNOSIS — L814 Other melanin hyperpigmentation: Secondary | ICD-10-CM | POA: Diagnosis not present

## 2018-02-14 DIAGNOSIS — D225 Melanocytic nevi of trunk: Secondary | ICD-10-CM | POA: Diagnosis not present

## 2018-02-20 ENCOUNTER — Encounter: Payer: Self-pay | Admitting: Family

## 2018-02-20 ENCOUNTER — Other Ambulatory Visit: Payer: Self-pay | Admitting: Internal Medicine

## 2018-02-20 DIAGNOSIS — G43001 Migraine without aura, not intractable, with status migrainosus: Secondary | ICD-10-CM | POA: Insufficient documentation

## 2018-02-20 MED ORDER — SUMATRIPTAN 5 MG/ACT NA SOLN: 1.0000 | NASAL | 1 refills | Status: DC | PRN

## 2018-02-20 MED FILL — SUMAtriptan 5 MG/ACT SOLN: 5 | 30 days supply | Qty: 6 | Fill #0

## 2018-03-13 MED FILL — ELINEST-28 TABLET: 0.3-30 | 84 days supply | Qty: 84 | Fill #0

## 2018-04-09 ENCOUNTER — Other Ambulatory Visit (INDEPENDENT_AMBULATORY_CARE_PROVIDER_SITE_OTHER): Payer: 59

## 2018-04-09 ENCOUNTER — Encounter: Payer: Self-pay | Admitting: Family

## 2018-04-09 ENCOUNTER — Ambulatory Visit (INDEPENDENT_AMBULATORY_CARE_PROVIDER_SITE_OTHER): Payer: 59 | Admitting: Family

## 2018-04-09 VITALS — BP 104/76 | HR 72 | Temp 98.3°F | Ht 65.0 in | Wt 155.1 lb

## 2018-04-09 DIAGNOSIS — M791 Myalgia, unspecified site: Secondary | ICD-10-CM | POA: Diagnosis not present

## 2018-04-09 DIAGNOSIS — Z8669 Personal history of other diseases of the nervous system and sense organs: Secondary | ICD-10-CM

## 2018-04-09 LAB — MAGNESIUM: Magnesium: 2.1 mg/dL (ref 1.5–2.5)

## 2018-04-09 LAB — SEDIMENTATION RATE: Sed Rate: 6 mm/hr (ref 0–20)

## 2018-04-09 LAB — VITAMIN B12: VITAMIN B 12: 240 pg/mL (ref 211–911)

## 2018-04-09 LAB — VITAMIN D 25 HYDROXY (VIT D DEFICIENCY, FRACTURES): VITD: 16.32 ng/mL — AB (ref 30.00–100.00)

## 2018-04-09 NOTE — Progress Notes (Signed)
Misty Austin is a 32 y.o. female with the following history as recorded in Epic[facility]:  Patient Active Problem List   Diagnosis Date Noted  . Migraine without aura and with status migrainosus, not intractable 02/20/2018  . Indication for care in labor and delivery, antepartum 05/12/2015  . NSVD (normal spontaneous vaginal delivery) 01/29/2013  . ABDOMINAL PAIN OTHER SPECIFIED SITE 07/31/2009    Current Outpatient Medications  Medication Sig Dispense Refill  . escitalopram (LEXAPRO) 10 MG tablet Take 10 mg by mouth daily.    . norgestrel-ethinyl estradiol (LO/OVRAL,CRYSELLE) 0.3-30 MG-MCG tablet Take 1 tablet by mouth daily.    . SUMAtriptan (IMITREX) 5 MG/ACT nasal spray Place 1 spray (5 mg total) into the nose every 2 (two) hours as needed for migraine. 8 Inhaler 1   No current facility-administered medications for this visit.     Allergies: Patient has no known allergies.  Past Medical History:  Diagnosis Date  . Anxiety and depression   . Congenital kidney disease    familial nephritis  . GERD (gastroesophageal reflux disease)   . 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 Hx of varicella   . IBS (irritable bowel syndrome)   . Thyroid nodule    benign folliculr on bx 02/1828  . Vaginal Pap smear, abnormal     Past Surgical History:  Procedure Laterality Date  . LAPAROSCOPIC CHOLECYSTECTOMY  09/2009   [facility]  . WISDOM TOOTH EXTRACTION      Family History  Problem Relation Age of Onset  . Esophageal cancer Maternal Grandfather   . Diabetes Maternal Grandmother   . Breast cancer Maternal Grandmother   . Lymphoma Paternal Grandfather   . Leukemia Paternal Grandfather   . Diabetes Paternal Grandmother   . Kidney disease Mother        familial nephritis  . Melanoma Mother 75  . Basal cell carcinoma Mother     Social History   Tobacco Use  . Smoking status: Never Smoker  . Smokeless tobacco: Never Used  Substance Use Topics  . Alcohol use:  No    Subjective:  Follow-up on headaches; opted not to try Imitrex due to concern for interaction with Lexapro; has seen dentist and is having a mouth guard made- hopes this will help with her symptoms; admits that in general, just doesn't feel great; stress level high- just found out that father has parotid tumor/ 2 small children at home; not ready to change Lexapro yet but admits may not be as effective as hoped;    Objective:  Vitals:   04/09/18 1606  BP: 104/76  Pulse: 72  Temp: 98.3 F (36.8 C)  TempSrc: Oral  SpO2: 97%  Weight: 155 lb 1.9 oz (70.4 kg)  Height: 5' 5"  (1.651 m)    General: Well developed, well nourished, in no acute distress  Skin : Warm and dry.  Head: Normocephalic and atraumatic  Eyes: Sclera and conjunctiva clear; pupils round and reactive to light; extraocular movements intact  Ears: External normal; canals clear; tympanic membranes normal  Oropharynx: Pink, supple. No suspicious lesions  Neck: Supple without thyromegaly, adenopathy  Lungs: Respirations unlabored; clear to auscultation bilaterally without wheeze, rales, rhonchi  CVS exam: normal rate and regular rhythm.  Neurologic: Alert and oriented; speech intact; face symmetrical; moves all extremities well; CNII-XII intact without focal deficit  Assessment:  1. Myalgia   2. Hx of migraine headaches     Plan:  Update labs today; patient to start  keeping headache journal once she get her mouth guard- may need to consider imaging/ daily preventive medication; also could consider changing from Lexapro to Cymbalta; follow-up to be determined.   No follow-ups on file.  Orders Placed This Encounter  Procedures  . Antinuclear Antib (ANA)    Standing Status:   Future    Number of Occurrences:   1    Standing Expiration Date:   04/09/2019  . Sed Rate (ESR)    Standing Status:   Future    Number of Occurrences:   1    Standing Expiration Date:   04/09/2019  . Rheumatoid Factor    Standing Status:    Future    Number of Occurrences:   1    Standing Expiration Date:   04/09/2019  . B12    Standing Status:   Future    Number of Occurrences:   1    Standing Expiration Date:   04/09/2019  . Vitamin D (25 hydroxy)    Standing Status:   Future    Number of Occurrences:   1    Standing Expiration Date:   04/09/2019  . Magnesium    Standing Status:   Future    Number of Occurrences:   1    Standing Expiration Date:   04/09/2019    Requested Prescriptions    No prescriptions requested or ordered in this encounter

## 2018-04-10 LAB — RHEUMATOID FACTOR: Rheumatoid fact SerPl-aCnc: <14 IU/mL (ref <14)

## 2018-04-10 LAB — ANA: Anti Nuclear Antibody(ANA): NEGATIVE

## 2018-04-11 ENCOUNTER — Ambulatory Visit: Payer: Self-pay | Admitting: Family

## 2018-04-11 ENCOUNTER — Encounter: Payer: Self-pay | Admitting: Family

## 2018-04-11 ENCOUNTER — Other Ambulatory Visit: Payer: Self-pay | Admitting: Family

## 2018-04-11 MED ORDER — VITAMIN D (ERGOCALCIFEROL) 1.25 MG (50000 UNIT) PO CAPS: 50000.0000 [IU] | ORAL_CAPSULE | ORAL | 0 refills | Status: AC

## 2018-04-11 MED FILL — VIT D2 1.25 MG (50,000 UNIT: 1.25 MG | 84 days supply | Qty: 12 | Fill #0

## 2018-04-17 MED FILL — ESCITALOPRAM 10 MG TABLET: 10 | 90 days supply | Qty: 90 | Fill #2

## 2018-04-24 ENCOUNTER — Other Ambulatory Visit: Payer: Self-pay

## 2018-04-24 ENCOUNTER — Emergency Department (INDEPENDENT_AMBULATORY_CARE_PROVIDER_SITE_OTHER)
Admission: EM | Admit: 2018-04-24 | Discharge: 2018-04-24 | Disposition: A | Discharge status: Home / Self Care | Payer: 59 | Source: Home / Self Care

## 2018-04-24 DIAGNOSIS — J069 Acute upper respiratory infection, unspecified: Secondary | ICD-10-CM | POA: Diagnosis not present

## 2018-04-24 DIAGNOSIS — B9789 Other viral agents as the cause of diseases classified elsewhere: Secondary | ICD-10-CM | POA: Diagnosis not present

## 2018-04-24 DIAGNOSIS — G43009 Migraine without aura, not intractable, without status migrainosus: Secondary | ICD-10-CM | POA: Diagnosis not present

## 2018-04-24 MED ORDER — METOCLOPRAMIDE HCL 5 MG/ML IJ SOLN
5.0000 mg | Freq: Once | INTRAMUSCULAR | Status: AC
  Administered 2018-04-24: 5 mg via INTRAMUSCULAR

## 2018-04-24 MED ORDER — DEXAMETHASONE SODIUM PHOSPHATE 10 MG/ML IJ SOLN
10.0000 mg | Freq: Once | INTRAMUSCULAR | Status: AC
  Administered 2018-04-24: 10 mg via INTRAMUSCULAR

## 2018-04-24 MED ORDER — KETOROLAC TROMETHAMINE 30 MG/ML IJ SOLN
30.0000 mg | Freq: Once | INTRAMUSCULAR | Status: AC
  Administered 2018-04-24: 30 mg via INTRAMUSCULAR

## 2018-04-24 NOTE — Discharge Instructions (Addendum)
As cold symptoms develop, try the following: Take plain guaifenesin (1200mg  extended release tabs such as Mucinex) twice daily, with plenty of water, for cough and congestion.  May add Pseudoephedrine (30mg , one or two every 4 to 6 hours) for sinus congestion.  Get adequate rest.   May use Afrin nasal spray (or generic oxymetazoline) each morning for about 5 days and then discontinue.  Also recommend using saline nasal spray several times daily and saline nasal irrigation (AYR is a common brand).  Use Flonase nasal spray each morning after using Afrin nasal spray and saline nasal irrigation. Try warm salt water gargles for sore throat.  Stop all antihistamines for now, and other non-prescription cough/cold preparations. May take Delsym Cough Suppressant at bedtime for nighttime cough.  Follow-up with family doctor if not improving about10 days.   If symptoms become significantly worse during the night or over the weekend, proceed to the local emergency room.

## 2018-04-24 NOTE — ED Provider Notes (Signed)
[facility]    CSN: [id number] Arrival date & time: 04/24/18  1700     History   Chief Complaint Chief Complaint  Patient presents with  . Headache    HPI Misty Austin is a 32 y.o. female.   Patient complains of three day history of typical cold-like symptoms developing over several days, including mild sore throat, sinus congestion, fatigue, myalgias, and cough.  Yesterday she had fever to 101 with chills and developed a mild headache.  Her headache has now transformed to a migraine, worse than her usual migraine headaches.  The history is provided by the patient.    Past Medical History:  Diagnosis Date  . Anxiety and depression   . Congenital kidney disease    familial nephritis  . GERD (gastroesophageal reflux disease)   . 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 Hx of varicella   . IBS (irritable bowel syndrome)   . Thyroid nodule    benign folliculr on bx 10/979  . Vaginal Pap smear, abnormal     Patient Active Problem List   Diagnosis Date Noted  . Migraine without aura and with status migrainosus, not intractable 02/20/2018  . Indication for care in labor and delivery, antepartum 05/12/2015  . NSVD (normal spontaneous vaginal delivery) 01/29/2013  . ABDOMINAL PAIN OTHER SPECIFIED SITE 07/31/2009    Past Surgical History:  Procedure Laterality Date  . LAPAROSCOPIC CHOLECYSTECTOMY  09/2009   [facility]  . WISDOM TOOTH EXTRACTION      OB History    Gravida  2   Para  2   Term  2   Preterm      AB      Living  2     SAB      TAB      Ectopic      Multiple  0   Live Births  2            Home Medications    Prior to Admission medications   Medication Sig Start Date End Date Taking? Authorizing Provider  escitalopram (LEXAPRO) 10 MG tablet Take 10 mg by mouth daily.    [provider]  norgestrel-ethinyl estradiol (LO/OVRAL,CRYSELLE) 0.3-30 MG-MCG tablet Take 1 tablet by mouth  daily.    [provider]  SUMAtriptan (IMITREX) 5 MG/ACT nasal spray Place 1 spray (5 mg total) into the nose every 2 (two) hours as needed for migraine. 02/20/18   Janith Lima, MD  Vitamin D, Ergocalciferol, (DRISDOL) 50000 units CAPS capsule Take 1 capsule (50,000 Units total) by mouth every 7 (seven) days for 12 doses. 04/11/18 06/28/18  Marrian Salvage, FNP    Family History Family History  Problem Relation Age of Onset  . Esophageal cancer Maternal Grandfather   . Diabetes Maternal Grandmother   . Breast cancer Maternal Grandmother   . Lymphoma Paternal Grandfather   . Leukemia Paternal Grandfather   . Diabetes Paternal Grandmother   . Kidney disease Mother        familial nephritis  . Melanoma Mother 60  . Basal cell carcinoma Mother     Social History Social History   Tobacco Use  . Smoking status: Never Smoker  . Smokeless tobacco: Never Used  Substance Use Topics  . Alcohol use: No  . Drug use: No     Allergies   Patient has no known allergies.   Review of Systems Review of Systems + sore throat + cough  No pleuritic pain No wheezing + nasal congestion + post-nasal drainage No sinus pain/pressure No itchy/red eyes No earache No hemoptysis No SOB + fever, + chills + nausea No vomiting No abdominal pain No diarrhea No urinary symptoms No skin rash + fatigue + myalgias + headache Used OTC meds without relief   Physical Exam Triage Vital Signs ED Triage Vitals  Enc Vitals Group     BP 04/24/18 1720 116/75     Pulse Rate 04/24/18 1720 96     Resp --      Temp 04/24/18 1720 98 F (36.7 C)     Temp Source 04/24/18 1720 Oral     SpO2 04/24/18 1720 97 %     Weight 04/24/18 1724 152 lb (68.9 kg)     Height 04/24/18 1724 5\' 5"  (1.651 m)     Head Circumference --      Peak Flow --      Pain Score 04/24/18 1724 10     Pain Loc --      Pain Edu? --      Excl. in [place]? --    No data found.  Updated Vital Signs BP 116/75 (BP  Location: Right Arm)   Pulse 96   Temp 98 F (36.7 C) (Oral)   Ht 5\' 5"  (1.651 m)   Wt 68.9 kg   LMP 04/10/2018   SpO2 97%   BMI 25.29 kg/m   Visual Acuity Right Eye Distance:   Left Eye Distance:   Bilateral Distance:    Right Eye Near:   Left Eye Near:    Bilateral Near:     Physical Exam Nursing notes and Vital Signs reviewed. Appearance:  Patient appears stated age, and in no acute distress. Eyes:  Pupils are equal, round, and reactive to light and accomodation.  Extraocular movement is intact.  Conjunctivae are not inflamed.  Fundi benign.  Mild photophobia present.  Ears:  Canals normal.  Tympanic membranes normal.  Nose:  Mildly congested turbinates.  No sinus tenderness.   Pharynx:  Normal Neck:  Supple.  Enlarged posterior/lateral nodes are palpated bilaterally, tender to palpation on the left.   Lungs:  Clear to auscultation.  Breath sounds are equal.  Moving air well. Heart:  Regular rate and rhythm without murmurs, rubs, or gallops.  Abdomen:  Nontender without masses or hepatosplenomegaly.  Bowel sounds are present.  No CVA or flank tenderness.  Extremities:  No edema.  Skin:  No rash present.  Neurologic:  Cranial nerves 2 through 12 are normal.  Patellar, achilles, and elbow reflexes are normal.  Cerebellar function is intact (finger-to-nose and rapid alternating hand movement).  Gait and station are normal.    UC Treatments / Results  Labs (all labs ordered are listed, but only abnormal results are displayed) Labs Reviewed - No data to display  EKG None  Radiology No results found.  Procedures Procedures (including critical care time)  Medications Ordered in UC Medications  ketorolac (TORADOL) 30 MG/ML injection 30 mg (has no administration in time range)  dexamethasone (DECADRON) injection 10 mg (has no administration in time range)  metoCLOPramide (REGLAN) injection 5 mg (has no administration in time range)    Initial Impression / Assessment  and Plan / UC Course  I have reviewed the triage vital signs and the nursing notes.  Pertinent labs & imaging results that were available during my care of the patient were reviewed by me and considered in my medical decision making (see chart  for details).    Migraine headache precipitated by viral URI. Administered Toradol 30mg  IM.  Administered Decadron 10mg  IM.  Administered Reglan 5mg  IM.  Followup with Family Doctor if not improved in 2 days.   Final Clinical Impressions(s) / UC Diagnoses   Final diagnoses:  Migraine without aura and without status migrainosus, not intractable  Viral URI with cough     Discharge Instructions     As cold symptoms develop, try the following: Take plain guaifenesin (1200mg  extended release tabs such as Mucinex) twice daily, with plenty of water, for cough and congestion.  May add Pseudoephedrine (30mg , one or two every 4 to 6 hours) for sinus congestion.  Get adequate rest.   May use Afrin nasal spray (or generic oxymetazoline) each morning for about 5 days and then discontinue.  Also recommend using saline nasal spray several times daily and saline nasal irrigation (AYR is a common brand).  Use Flonase nasal spray each morning after using Afrin nasal spray and saline nasal irrigation. Try warm salt water gargles for sore throat.  Stop all antihistamines for now, and other non-prescription cough/cold preparations. May take Delsym Cough Suppressant at bedtime for nighttime cough.  Follow-up with family doctor if not improving about10 days.   If symptoms become significantly worse during the night or over the weekend, proceed to the local emergency room.     ED Prescriptions    None        Kandra Nicolas, MD 04/26/18 1451

## 2018-04-24 NOTE — ED Triage Notes (Signed)
Pt stated that she has had a severe headache since 4:30 this morning.  Has had migraines before, but not this severe, and it is thought that it was related to her bite guard.  She has changed her bite guard, and has not had one since until this morning.  Has been taking ibuprofen 800 mg alternating with tylenol 1000mg   Every 3 hours.

## 2018-04-25 ENCOUNTER — Telehealth: Payer: Self-pay

## 2018-04-25 NOTE — Telephone Encounter (Signed)
Left message on cell with contact information if any questions or concerns.

## 2018-06-07 MED FILL — ELINEST-28 TABLET: 0.3-30 | 84 days supply | Qty: 84 | Fill #1

## 2018-08-13 MED FILL — ESCITALOPRAM 10 MG TABLET: 10 | 90 days supply | Qty: 90 | Fill #3

## 2018-09-04 MED FILL — ELINEST-28 TABLET: 0.3-30 | 84 days supply | Qty: 84 | Fill #0

## 2018-09-27 ENCOUNTER — Telehealth: Payer: 59 | Admitting: Family

## 2018-09-27 DIAGNOSIS — J329 Chronic sinusitis, unspecified: Secondary | ICD-10-CM | POA: Diagnosis not present

## 2018-09-27 DIAGNOSIS — B9689 Other specified bacterial agents as the cause of diseases classified elsewhere: Secondary | ICD-10-CM

## 2018-09-27 MED ORDER — AMOXICILLIN-POT CLAVULANATE 875-125 MG PO TABS: 1.0000 | ORAL_TABLET | Freq: Two times a day (BID) | ORAL | 0 refills | Status: DC

## 2018-09-27 NOTE — Progress Notes (Signed)

## 2018-11-15 DIAGNOSIS — N08 Glomerular disorders in diseases classified elsewhere: Secondary | ICD-10-CM | POA: Diagnosis not present

## 2018-11-15 DIAGNOSIS — Z3041 Encounter for surveillance of contraceptive pills: Secondary | ICD-10-CM | POA: Diagnosis not present

## 2018-11-15 DIAGNOSIS — Z1389 Encounter for screening for other disorder: Secondary | ICD-10-CM | POA: Diagnosis not present

## 2018-11-15 DIAGNOSIS — R635 Abnormal weight gain: Secondary | ICD-10-CM | POA: Diagnosis not present

## 2018-11-15 DIAGNOSIS — Z01419 Encounter for gynecological examination (general) (routine) without abnormal findings: Secondary | ICD-10-CM | POA: Diagnosis not present

## 2018-11-15 DIAGNOSIS — F329 Major depressive disorder, single episode, unspecified: Secondary | ICD-10-CM | POA: Diagnosis not present

## 2018-11-15 DIAGNOSIS — Z6826 Body mass index (BMI) 26.0-26.9, adult: Secondary | ICD-10-CM | POA: Diagnosis not present

## 2018-11-15 DIAGNOSIS — Z130 Encounter for screening for diseases of the blood and blood-forming organs and certain disorders involving the immune mechanism: Secondary | ICD-10-CM | POA: Diagnosis not present

## 2018-11-15 MED FILL — ESCITALOPRAM 10 MG TABLET: 10 | 90 days supply | Qty: 90 | Fill #0

## 2018-11-16 DIAGNOSIS — Z124 Encounter for screening for malignant neoplasm of cervix: Secondary | ICD-10-CM | POA: Diagnosis not present

## 2018-11-16 LAB — HM PAP SMEAR: HPV, high-risk: NEGATIVE

## 2018-11-22 MED FILL — ELINEST-28 TABLET: 0.3-30 | 84 days supply | Qty: 84 | Fill #0

## 2019-03-02 MED FILL — ESCITALOPRAM 10 MG TABLET: 10 | 90 days supply | Qty: 90 | Fill #0

## 2019-03-02 MED FILL — ELINEST-28 TABLET: 0.3-30 | 84 days supply | Qty: 84 | Fill #0

## 2019-05-29 ENCOUNTER — Telehealth: Payer: 59 | Admitting: Family

## 2019-05-29 DIAGNOSIS — T7840XA Allergy, unspecified, initial encounter: Secondary | ICD-10-CM | POA: Diagnosis not present

## 2019-05-29 MED ORDER — PREDNISONE 10 MG (21) PO TBPK
ORAL_TABLET | ORAL | 0 refills | Status: DC
Start: 2019-05-29 — End: 2019-10-28

## 2019-05-29 MED FILL — predniSONE 10 MG TABS: 10 | 6 days supply | Qty: 21 | Fill #0

## 2019-05-29 MED FILL — ESCITALOPRAM 10 MG TABLET: 10 | 90 days supply | Qty: 90 | Fill #0

## 2019-05-29 MED FILL — ELINEST-28 TABLET: 0.3-30 | 84 days supply | Qty: 84 | Fill #0

## 2019-05-29 NOTE — Progress Notes (Signed)
E Visit for Rash  We are sorry that you are not feeling well. Here is how we plan to help!  Based on what you shared with me it looks like you have contact dermatitis.  Contact dermatitis is a skin rash caused by something that touches the skin and causes irritation or inflammation.  Your skin may be red, swollen, dry, cracked, and itch.  The rash should go away in a few days but can last a few weeks.  If you get a rash, it's important to figure out what caused it so the irritant can be avoided in the future.  Prednisone 10 mg daily for 6 days (see taper instructions below)  Directions for 6 day taper: Day 1: 2 tablets before breakfast, 1 after both lunch & dinner and 2 at bedtime Day 2: 1 tab before breakfast, 1 after both lunch & dinner and 2 at bedtime Day 3: 1 tab at each meal & 1 at bedtime Day 4: 1 tab at breakfast, 1 at lunch, 1 at bedtime Day 5: 1 tab at breakfast & 1 tab at bedtime Day 6: 1 tab at breakfast    HOME CARE:   Take cool showers and avoid direct sunlight.  Apply cool compress or wet dressings.  Take a bath in an oatmeal bath.  Sprinkle content of one Aveeno packet under running faucet with comfortably warm water.  Bathe for 15-20 minutes, 1-2 times daily.  Pat dry with a towel. Do not rub the rash.  Use hydrocortisone cream.  Take an antihistamine like Benadryl for widespread rashes that itch.  The adult dose of Benadryl is 25-50 mg by mouth 4 times daily.  Caution:  This type of medication may cause sleepiness.  Do not drink alcohol, drive, or operate dangerous machinery while taking antihistamines.  Do not take these medications if you have prostate enlargement.  Read package instructions thoroughly on all medications that you take.  GET HELP RIGHT AWAY IF:   Symptoms don't go away after treatment.  Severe itching that persists.  If you rash spreads or swells.  If you rash begins to smell.  If it blisters and opens or develops a yellow-brown  crust.  You develop a fever.  You have a sore throat.  You become short of breath.  MAKE SURE YOU:  Understand these instructions. Will watch your condition. Will get help right away if you are not doing well or get worse.  Thank you for choosing an e-visit. Your e-visit answers were reviewed by a board certified advanced clinical practitioner to complete your personal care plan. Depending upon the condition, your plan could have included both over the counter or prescription medications. Please review your pharmacy choice. Be sure that the pharmacy you have chosen is open so that you can pick up your prescription now.  If there is a problem you may message your provider in MyChart to have the prescription routed to another pharmacy. Your safety is important to us. If you have drug allergies check your prescription carefully.  For the next 24 hours, you can use MyChart to ask questions about today's visit, request a non-urgent call back, or ask for a work or school excuse from your e-visit provider. You will get an email in the next two days asking about your experience. I hope that your e-visit has been valuable and will speed your recovery.    Approximately 5 minutes was spent documenting and reviewing patient's chart.    

## 2019-08-23 MED FILL — ELINEST-28 TABLET: 0.3-30 | 84 days supply | Qty: 84 | Fill #1

## 2019-10-18 MED FILL — ESCITALOPRAM 10 MG TABLET: 10 | 90 days supply | Qty: 90 | Fill #1

## 2019-10-28 ENCOUNTER — Ambulatory Visit: Payer: 59 | Admitting: Family

## 2019-10-28 ENCOUNTER — Encounter: Payer: Self-pay | Admitting: Family

## 2019-10-28 ENCOUNTER — Other Ambulatory Visit: Payer: Self-pay

## 2019-10-28 VITALS — BP 110/78 | HR 78 | Temp 98.5°F | Wt 156.6 lb

## 2019-10-28 DIAGNOSIS — R102 Pelvic and perineal pain: Secondary | ICD-10-CM | POA: Diagnosis not present

## 2019-10-28 LAB — CBC WITH DIFFERENTIAL/PLATELET
Basophils Absolute: 0 10*3/uL (ref 0.0–0.1)
Basophils Relative: 0.8 % (ref 0.0–3.0)
Eosinophils Absolute: 0.1 10*3/uL (ref 0.0–0.7)
Eosinophils Relative: 2.3 % (ref 0.0–5.0)
HCT: 36.1 % (ref 36.0–46.0)
Hemoglobin: 12.5 g/dL (ref 12.0–15.0)
Lymphocytes Relative: 45.7 % (ref 12.0–46.0)
Lymphs Abs: 2 10*3/uL (ref 0.7–4.0)
MCHC: 34.6 g/dL (ref 30.0–36.0)
MCV: 89.3 fl (ref 78.0–100.0)
Monocytes Absolute: 0.3 10*3/uL (ref 0.1–1.0)
Monocytes Relative: 7.3 % (ref 3.0–12.0)
Neutro Abs: 1.9 10*3/uL (ref 1.4–7.7)
Neutrophils Relative %: 43.9 % (ref 43.0–77.0)
Platelets: 242 10*3/uL (ref 150.0–400.0)
RBC: 4.04 Mil/uL (ref 3.87–5.11)
RDW: 12.4 % (ref 11.5–15.5)
WBC: 4.4 10*3/uL (ref 4.0–10.5)

## 2019-10-28 NOTE — Progress Notes (Signed)
Misty Austin is a 34 y.o. female with the following history as recorded in EpicCare:  Patient Active Problem List   Diagnosis Date Noted  . Migraine without aura and with status migrainosus, not intractable 02/20/2018  . Indication for care in labor and delivery, antepartum 05/12/2015  . NSVD (normal spontaneous vaginal delivery) 01/29/2013  . ABDOMINAL PAIN OTHER SPECIFIED SITE 07/31/2009    Current Outpatient Medications  Medication Sig Dispense Refill  . escitalopram (LEXAPRO) 10 MG tablet Take 10 mg by mouth daily.    . norgestrel-ethinyl estradiol (LO/OVRAL,CRYSELLE) 0.3-30 MG-MCG tablet Take 1 tablet by mouth daily.     No current facility-administered medications for this visit.    Allergies: Patient has no known allergies.  Past Medical History:  Diagnosis Date  . Anxiety and depression   . Congenital kidney disease    familial nephritis  . GERD (gastroesophageal reflux disease)   . 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 Hx of varicella   . IBS (irritable bowel syndrome)   . Thyroid nodule    benign folliculr on bx [id number]  . Vaginal Pap smear, abnormal     Past Surgical History:  Procedure Laterality Date  . LAPAROSCOPIC CHOLECYSTECTOMY  09/2009   [facility]  . WISDOM TOOTH EXTRACTION      Family History  Problem Relation Age of Onset  . Esophageal cancer Maternal Grandfather   . Diabetes Maternal Grandmother   . Breast cancer Maternal Grandmother   . Lymphoma Paternal Grandfather   . Leukemia Paternal Grandfather   . Diabetes Paternal Grandmother   . Kidney disease Mother        familial nephritis  . Melanoma Mother 52  . Basal cell carcinoma Mother     Social History   Tobacco Use  . Smoking status: Never Smoker  . Smokeless tobacco: Never Used  Substance Use Topics  . Alcohol use: No    Subjective:  RLQ pain x 1 month; notes that pain is intermittent; history of ovarian cysts; no nausea, vomiting; able to go to work  normally; no concerning changes in bowel habits; + appendix; no fever; is on OCP- mid- cycle; no urinary symptoms;     Objective:  Vitals:   10/28/19 0850  BP: 110/78  Pulse: 78  Temp: 98.5 F (36.9 C)  TempSrc: Oral  SpO2: 98%  Weight: 156 lb 9.6 oz (71 kg)    General: Well developed, well nourished, in no acute distress  Skin : Warm and dry.  Head: Normocephalic and atraumatic  Eyes: Sclera and conjunctiva clear; pupils round and reactive to light; extraocular movements intact  Ears: External normal; canals clear; tympanic membranes normal  Oropharynx: Pink, supple. No suspicious lesions  Neck: Supple without thyromegaly, adenopathy  Lungs: Respirations unlabored; clear to auscultation bilaterally without wheeze, rales, rhonchi  Abdomen: Soft; nontender; nondistended; normoactive bowel sounds; no masses or hepatosplenomegaly  Musculoskeletal: No deformities; no active joint inflammation  Extremities: No edema, cyanosis, clubbing  Vessels: Symmetric bilaterally  Neurologic: Alert and oriented; speech intact; face symmetrical; moves all extremities well; CNII-XII intact without focal deficit   Assessment:  1. Pelvic pain     Plan:  ? Ovarian cyst; check CBC today; plan for pelvic ultrasound; low suspicion for appendicitis based on length of time symptoms have been present; follow-up to be determined.   This visit occurred during the SARS-CoV-2 public health emergency.  Safety protocols were in place, including screening questions prior to the visit, additional usage  of staff PPE, and extensive cleaning of exam room while observing appropriate contact time as indicated for disinfecting solutions.     No follow-ups on file.  Orders Placed This Encounter  Procedures  . US Pelvic Complete With Transvaginal    Standing Status:   Future    Standing Expiration Date:   12/27/2020    Order Specific Question:   Reason for Exam (SYMPTOM  OR DIAGNOSIS REQUIRED)    Answer:   pelvic  pain/ history of ovarian cyst    Order Specific Question:   Preferred imaging location?    Answer:   [facility]  . CBC with Differential/Platelet    Requested Prescriptions    No prescriptions requested or ordered in this encounter

## 2019-11-01 ENCOUNTER — Ambulatory Visit
Admission: RE | Admit: 2019-11-01 | Discharge: 2019-11-01 | Disposition: A | Discharge status: Home / Self Care | Payer: 59 | Source: Ambulatory Visit | Attending: Family | Admitting: Family

## 2019-11-01 DIAGNOSIS — R102 Pelvic and perineal pain: Secondary | ICD-10-CM

## 2019-11-19 ENCOUNTER — Telehealth: Payer: 59 | Admitting: Physician Assistant

## 2019-11-19 DIAGNOSIS — B9689 Other specified bacterial agents as the cause of diseases classified elsewhere: Secondary | ICD-10-CM

## 2019-11-19 DIAGNOSIS — J329 Chronic sinusitis, unspecified: Secondary | ICD-10-CM

## 2019-11-19 MED ORDER — AMOXICILLIN-POT CLAVULANATE 875-125 MG PO TABS: 1.0000 | ORAL_TABLET | Freq: Two times a day (BID) | ORAL | 0 refills | Status: AC

## 2019-11-19 MED ORDER — PREDNISONE 5 MG PO TABS: ORAL_TABLET | ORAL | 0 refills | Status: DC

## 2019-11-19 MED ORDER — BENZONATATE 100 MG PO CAPS: 100.0000 mg | ORAL_CAPSULE | Freq: Three times a day (TID) | ORAL | 0 refills | Status: DC | PRN

## 2019-11-19 MED ORDER — PSEUDOEPHEDRINE-CODEINE-GG 30-10-100 MG/5ML PO SOLN: 10.0000 mL | Freq: Four times a day (QID) | ORAL | 0 refills | Status: DC | PRN

## 2019-11-19 MED FILL — AMOX-CLAV 875-125 MG TABLET: 875-125 | 7 days supply | Qty: 14 | Fill #0

## 2019-11-19 NOTE — Progress Notes (Addendum)
Hi Beda,   Thank you for reaching out.  I apologize for the error! Please see below for your treatment plan.  I hope you feel better soon!  Be sure to stay well hydrated.  Try nasal saline rinses to help with symptoms.   Based on what you have shared with me it looks like you have Acute Bacterial Sinusitis.  This is an infection caused by bacteria and is treated with antibiotics. I have prescribed Augmentin 875mg /125mg  one tablet twice daily with food, for 7 days. You may use an oral decongestant such as Mucinex D or if you have glaucoma or high blood pressure use plain Mucinex. Saline nasal spray help and can safely be used as often as needed for congestion.  If you develop worsening sinus pain, fever or notice severe headache and vision changes, or if symptoms are not better after completion of antibiotic, please schedule an appointment with a health care provider.    Sinus infections are not as easily transmitted as other respiratory infection, however we still recommend that you avoid close contact with loved ones, especially the very young and elderly.  Remember to wash your hands thoroughly throughout the day as this is the number one way to prevent the spread of infection!  Home Care:  Only take medications as instructed by your medical team.  Complete the entire course of an antibiotic.  Do not take these medications with alcohol.  A steam or ultrasonic humidifier can help congestion.  You can place a towel over your head and breathe in the steam from hot water coming from a faucet.  Avoid close contacts especially the very young and the elderly.  Cover your mouth when you cough or sneeze.  Always remember to wash your hands.  Get Help Right Away If:  You develop worsening fever or sinus pain.  You develop a severe head ache or visual changes.  Your symptoms persist after you have completed your treatment plan.  Make sure you  Understand these instructions.  Will  watch your condition.  Will get help right away if you are not doing well or get worse.  Your e-visit answers were reviewed by a board certified advanced clinical practitioner to complete your personal care plan.  Depending on the condition, your plan could have included both over the counter or prescription medications.  If there is a problem please reply  once you have received a response from your provider.  Your safety is important to [place].  If you have drug allergies check your prescription carefully.    You can use MyChart to ask questions about today's visit, request a non-urgent call back, or ask for a work or school excuse for 24 hours related to this e-Visit. If it has been greater than 24 hours you will need to follow up with your provider, or enter a new e-Visit to address those concerns.  You will get an e-mail in the next two days asking about your experience.  I hope that your e-visit has been valuable and will speed your recovery. Thank you for using e-visits.   Greater than 5 minutes, yet less than 10 minutes of time have been spent researching, coordinating and implementing care for this patient today.

## 2019-11-19 NOTE — Addendum Note (Signed)
Addended by: Dorise Hiss on: 11/19/2019 02:21 PM   Modules accepted: Orders

## 2020-01-01 ENCOUNTER — Other Ambulatory Visit (INDEPENDENT_AMBULATORY_CARE_PROVIDER_SITE_OTHER): Payer: 59

## 2020-01-01 ENCOUNTER — Other Ambulatory Visit: Payer: Self-pay | Admitting: Family

## 2020-01-01 ENCOUNTER — Encounter: Payer: Self-pay | Admitting: Family

## 2020-01-01 DIAGNOSIS — E559 Vitamin D deficiency, unspecified: Secondary | ICD-10-CM

## 2020-01-01 DIAGNOSIS — R5383 Other fatigue: Secondary | ICD-10-CM | POA: Diagnosis not present

## 2020-01-01 DIAGNOSIS — M791 Myalgia, unspecified site: Secondary | ICD-10-CM

## 2020-01-01 LAB — CBC WITH DIFFERENTIAL/PLATELET
Basophils Absolute: 0 10*3/uL (ref 0.0–0.1)
Basophils Relative: 0.7 % (ref 0.0–3.0)
Eosinophils Absolute: 0.1 10*3/uL (ref 0.0–0.7)
Eosinophils Relative: 2 % (ref 0.0–5.0)
HCT: 35.6 % — ABNORMAL LOW (ref 36.0–46.0)
Hemoglobin: 12.2 g/dL (ref 12.0–15.0)
Lymphocytes Relative: 42 % (ref 12.0–46.0)
Lymphs Abs: 2.5 10*3/uL (ref 0.7–4.0)
MCHC: 34.2 g/dL (ref 30.0–36.0)
MCV: 89.8 fl (ref 78.0–100.0)
Monocytes Absolute: 0.4 10*3/uL (ref 0.1–1.0)
Monocytes Relative: 6.7 % (ref 3.0–12.0)
Neutro Abs: 2.9 10*3/uL (ref 1.4–7.7)
Neutrophils Relative %: 48.6 % (ref 43.0–77.0)
Platelets: 264 10*3/uL (ref 150.0–400.0)
RBC: 3.96 Mil/uL (ref 3.87–5.11)
RDW: 12.5 % (ref 11.5–15.5)
WBC: 6 10*3/uL (ref 4.0–10.5)

## 2020-01-01 LAB — COMPREHENSIVE METABOLIC PANEL
ALT: 12 U/L (ref 0–35)
AST: 15 U/L (ref 0–37)
Albumin: 4.3 g/dL (ref 3.5–5.2)
Alkaline Phosphatase: 43 U/L (ref 39–117)
BUN: 19 mg/dL (ref 6–23)
CO2: 29 mEq/L (ref 19–32)
Calcium: 9.3 mg/dL (ref 8.4–10.5)
Chloride: 104 mEq/L (ref 96–112)
Creatinine, Ser: 0.87 mg/dL (ref 0.40–1.20)
GFR: 74.62 mL/min (ref >60.00)
Glucose, Bld: 88 mg/dL (ref 70–99)
Potassium: 3.7 mEq/L (ref 3.5–5.1)
Sodium: 140 mEq/L (ref 135–145)
Total Bilirubin: 0.3 mg/dL (ref 0.2–1.2)
Total Protein: 6.9 g/dL (ref 6.0–8.3)

## 2020-01-01 LAB — URIC ACID: Uric Acid, Serum: 4.9 mg/dL (ref 2.4–7.0)

## 2020-01-01 LAB — VITAMIN D 25 HYDROXY (VIT D DEFICIENCY, FRACTURES): VITD: 21.21 ng/mL — ABNORMAL LOW (ref 30.00–100.00)

## 2020-01-01 LAB — MAGNESIUM: Magnesium: 2.1 mg/dL (ref 1.5–2.5)

## 2020-01-01 LAB — VITAMIN B12: Vitamin B-12: 166 pg/mL — ABNORMAL LOW (ref 211–911)

## 2020-01-01 LAB — SEDIMENTATION RATE: Sed Rate: 8 mm/hr (ref 0–20)

## 2020-01-02 LAB — ANA: Anti Nuclear Antibody (ANA): NEGATIVE

## 2020-01-02 LAB — RHEUMATOID FACTOR: Rheumatoid fact SerPl-aCnc: <14 IU/mL (ref <14)

## 2020-01-03 ENCOUNTER — Other Ambulatory Visit: Payer: Self-pay | Admitting: Family

## 2020-01-03 MED ORDER — VITAMIN D (ERGOCALCIFEROL) 1.25 MG (50000 UNIT) PO CAPS: 50000.0000 [IU] | ORAL_CAPSULE | ORAL | 0 refills | Status: AC

## 2020-01-03 MED FILL — VIT D2 1.25 MG (50,000 UNIT: 1.25 MG | 84 days supply | Qty: 12 | Fill #0

## 2020-01-09 ENCOUNTER — Telehealth: Payer: Self-pay

## 2020-01-09 ENCOUNTER — Ambulatory Visit (INDEPENDENT_AMBULATORY_CARE_PROVIDER_SITE_OTHER): Payer: 59

## 2020-01-09 ENCOUNTER — Other Ambulatory Visit: Payer: Self-pay

## 2020-01-09 DIAGNOSIS — E538 Deficiency of other specified B group vitamins: Secondary | ICD-10-CM | POA: Diagnosis not present

## 2020-01-09 MED ORDER — CYANOCOBALAMIN 1000 MCG/ML IJ SOLN
1000.0000 ug | INTRAMUSCULAR | 0 refills | Status: AC
Start: 2020-01-09 — End: 2020-01-31

## 2020-01-09 MED ORDER — "BD ECLIPSE SYRINGE 22G X 1"" 3 ML MISC": 0 refills | Status: DC

## 2020-01-09 MED ORDER — CYANOCOBALAMIN 1000 MCG/ML IJ SOLN
1000.0000 ug | INTRAMUSCULAR | Status: AC
  Administered 2020-01-09: 1000 ug via INTRAMUSCULAR

## 2020-01-09 NOTE — Progress Notes (Signed)
Pt here for weekly B12 injection per Jodi Mourning NP.  This is the 1st of 4.  B12 1040mcg given IM left deltoid and pt tolerated injection well.  Pt states she will not be in town next week & is wondering if she can administer the weekly shots at home.  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

## 2020-01-09 NOTE — Addendum Note (Signed)
Addended by: Hoyt Koch on: 01/09/2020 04:38 PM   Modules accepted: Orders

## 2020-01-09 NOTE — Telephone Encounter (Signed)
Pt states she is going out of town next week & will not be able to come in for her weekly b12 injection.  She is requesting that an injection medication be sent to the Outpatient pharmacy at [facility] so that she may be able to give the injection herself.  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

## 2020-01-09 NOTE — Progress Notes (Signed)
Medical treatment/procedure(s) were performed by non-physician practitioner and as supervising physician I was immediately available for consultation/collaboration. I agree with above. Farin Buhman A Dawsen Krieger, MD  

## 2020-01-09 NOTE — Telephone Encounter (Signed)
Sent in B12 and syringes which should come with needles.

## 2020-01-10 NOTE — Telephone Encounter (Signed)
Pt called & notified that Dr Sharlet Salina Sent in B12 and syringes which should come with needles to the pharmacy that she requested.  Pt verb understanding.

## 2020-01-11 MED FILL — CYANOCOBALAMIN 1,000 MCG/ML: 1000 | 27 days supply | Qty: 4 | Fill #0

## 2020-02-05 MED FILL — ESCITALOPRAM 10 MG TABLET: 10 | 30 days supply | Qty: 30 | Fill #0

## 2020-02-27 ENCOUNTER — Other Ambulatory Visit (HOSPITAL_BASED_OUTPATIENT_CLINIC_OR_DEPARTMENT_OTHER): Payer: Self-pay | Admitting: Obstetrics and Gynecology

## 2020-02-27 MED FILL — ELINEST 0.3-30 MG-MCG TABS: 0.3-30 | 28 days supply | Qty: 28 | Fill #0

## 2020-03-06 ENCOUNTER — Ambulatory Visit: Payer: 59 | Admitting: Family

## 2020-03-06 DIAGNOSIS — Z0000 Encounter for general adult medical examination without abnormal findings: Secondary | ICD-10-CM | POA: Diagnosis not present

## 2020-03-06 DIAGNOSIS — E569 Vitamin deficiency, unspecified: Secondary | ICD-10-CM | POA: Diagnosis not present

## 2020-03-18 ENCOUNTER — Other Ambulatory Visit (HOSPITAL_BASED_OUTPATIENT_CLINIC_OR_DEPARTMENT_OTHER): Payer: Self-pay | Admitting: Obstetrics and Gynecology

## 2020-03-18 DIAGNOSIS — Z3041 Encounter for surveillance of contraceptive pills: Secondary | ICD-10-CM | POA: Diagnosis not present

## 2020-03-18 DIAGNOSIS — F419 Anxiety disorder, unspecified: Secondary | ICD-10-CM | POA: Diagnosis not present

## 2020-03-18 DIAGNOSIS — Z01419 Encounter for gynecological examination (general) (routine) without abnormal findings: Secondary | ICD-10-CM | POA: Diagnosis not present

## 2020-03-18 DIAGNOSIS — E669 Obesity, unspecified: Secondary | ICD-10-CM | POA: Diagnosis not present

## 2020-03-18 DIAGNOSIS — Z124 Encounter for screening for malignant neoplasm of cervix: Secondary | ICD-10-CM | POA: Diagnosis not present

## 2020-03-18 DIAGNOSIS — Z130 Encounter for screening for diseases of the blood and blood-forming organs and certain disorders involving the immune mechanism: Secondary | ICD-10-CM | POA: Diagnosis not present

## 2020-03-18 DIAGNOSIS — Z1389 Encounter for screening for other disorder: Secondary | ICD-10-CM | POA: Diagnosis not present

## 2020-03-18 MED FILL — ESCITALOPRAM 10 MG TABLET: 10 | 90 days supply | Qty: 90 | Fill #0

## 2020-04-17 MED FILL — ELINEST 0.3-30 MG-MCG TABS: 0.3-30 | 84 days supply | Qty: 84 | Fill #1

## 2020-04-20 ENCOUNTER — Telehealth: Payer: 59 | Admitting: Emergency Medicine

## 2020-04-20 ENCOUNTER — Other Ambulatory Visit: Payer: Self-pay | Admitting: Emergency Medicine

## 2020-04-20 DIAGNOSIS — R110 Nausea: Secondary | ICD-10-CM | POA: Diagnosis not present

## 2020-04-20 DIAGNOSIS — J329 Chronic sinusitis, unspecified: Secondary | ICD-10-CM

## 2020-04-20 MED ORDER — DOXYCYCLINE HYCLATE 100 MG PO CAPS
100.0000 mg | ORAL_CAPSULE | Freq: Two times a day (BID) | ORAL | 0 refills | Status: DC
Start: 2020-04-20 — End: 2020-07-08

## 2020-04-20 MED ORDER — ONDANSETRON 4 MG PO TBDP
4.0000 mg | ORAL_TABLET | Freq: Three times a day (TID) | ORAL | 0 refills | Status: DC | PRN
Start: 2020-04-20 — End: 2020-07-08

## 2020-04-20 MED FILL — DOXYCYCLINE HYCLATE 100 MG: 100 | 10 days supply | Qty: 20 | Fill #0

## 2020-04-20 MED FILL — ONDANSETRON ODT 4 MG TABLET: 4 | 3 days supply | Qty: 10 | Fill #0

## 2020-04-20 NOTE — Progress Notes (Signed)
We are sorry that you are not feeling well.  Here is how we plan to help!  Based on what you have shared with me it looks like you have sinusitis.  Sinusitis is inflammation and infection in the sinus cavities of the head.  Based on your presentation I believe you most likely have Acute Bacterial Sinusitis.  This is an infection caused by bacteria and is treated with antibiotics. I have prescribed Doxycycline 100mg  by mouth twice a day for 10 days. You may use an oral decongestant such as Mucinex D or if you have glaucoma or high blood pressure use plain Mucinex. Saline nasal spray help and can safely be used as often as needed for congestion.  If you develop worsening sinus pain, fever or notice severe headache and vision changes, or if symptoms are not better after completion of antibiotic, please schedule an appointment with a health care provider.    I've also sent some Zofran to help with nausea.  Sinus infections are not as easily transmitted as other respiratory infection, however we still recommend that you avoid close contact with loved ones, especially the very young and elderly.  Remember to wash your hands thoroughly throughout the day as this is the number one way to prevent the spread of infection!  Home Care:  Only take medications as instructed by your medical team.  Complete the entire course of an antibiotic.  Do not take these medications with alcohol.  A steam or ultrasonic humidifier can help congestion.  You can place a towel over your head and breathe in the steam from hot water coming from a faucet.  Avoid close contacts especially the very young and the elderly.  Cover your mouth when you cough or sneeze.  Always remember to wash your hands.  Get Help Right Away If:  You develop worsening fever or sinus pain.  You develop a severe head ache or visual changes.  Your symptoms persist after you have completed your treatment plan.  Make sure you  Understand  these instructions.  Will watch your condition.  Will get help right away if you are not doing well or get worse.  Your e-visit answers were reviewed by a board certified advanced clinical practitioner to complete your personal care plan.  Depending on the condition, your plan could have included both over the counter or prescription medications.  If there is a problem please reply  once you have received a response from your provider.  Your safety is important to [place].  If you have drug allergies check your prescription carefully.    You can use MyChart to ask questions about today's visit, request a non-urgent call back, or ask for a work or school excuse for 24 hours related to this e-Visit. If it has been greater than 24 hours you will need to follow up with your provider, or enter a new e-Visit to address those concerns.  You will get an e-mail in the next two days asking about your experience.  I hope that your e-visit has been valuable and will speed your recovery. Thank you for using e-visits.   Approximately 5 minutes was used in reviewing the patient's chart, questionnaire, prescribing medications, and documentation.

## 2020-07-08 ENCOUNTER — Ambulatory Visit: Payer: 59 | Admitting: Family

## 2020-07-08 ENCOUNTER — Encounter: Payer: Self-pay | Admitting: Family

## 2020-07-08 ENCOUNTER — Other Ambulatory Visit: Payer: Self-pay | Admitting: Family

## 2020-07-08 ENCOUNTER — Other Ambulatory Visit: Payer: Self-pay

## 2020-07-08 VITALS — BP 110/76 | HR 74 | Temp 98.7°F | Ht 65.0 in | Wt 160.0 lb

## 2020-07-08 DIAGNOSIS — R5383 Other fatigue: Secondary | ICD-10-CM | POA: Diagnosis not present

## 2020-07-08 DIAGNOSIS — K649 Unspecified hemorrhoids: Secondary | ICD-10-CM | POA: Diagnosis not present

## 2020-07-08 DIAGNOSIS — E538 Deficiency of other specified B group vitamins: Secondary | ICD-10-CM

## 2020-07-08 DIAGNOSIS — K219 Gastro-esophageal reflux disease without esophagitis: Secondary | ICD-10-CM

## 2020-07-08 DIAGNOSIS — R7989 Other specified abnormal findings of blood chemistry: Secondary | ICD-10-CM

## 2020-07-08 LAB — VITAMIN B12: Vitamin B-12: 155 pg/mL — ABNORMAL LOW (ref 211–911)

## 2020-07-08 LAB — COMPREHENSIVE METABOLIC PANEL
ALT: 16 U/L (ref 0–35)
AST: 12 U/L (ref 0–37)
Albumin: 4.1 g/dL (ref 3.5–5.2)
Alkaline Phosphatase: 50 U/L (ref 39–117)
BUN: 17 mg/dL (ref 6–23)
CO2: 29 mEq/L (ref 19–32)
Calcium: 8.9 mg/dL (ref 8.4–10.5)
Chloride: 106 mEq/L (ref 96–112)
Creatinine, Ser: 0.73 mg/dL (ref 0.40–1.20)
GFR: 107.37 mL/min (ref >60.00)
Glucose, Bld: 74 mg/dL (ref 70–99)
Potassium: 3.9 mEq/L (ref 3.5–5.1)
Sodium: 139 mEq/L (ref 135–145)
Total Bilirubin: 0.2 mg/dL (ref 0.2–1.2)
Total Protein: 6.5 g/dL (ref 6.0–8.3)

## 2020-07-08 MED ORDER — FAMOTIDINE 20 MG PO TABS
20.0000 mg | ORAL_TABLET | Freq: Two times a day (BID) | ORAL | 1 refills | Status: DC
Start: 2020-07-08 — End: 2020-08-20

## 2020-07-08 MED ORDER — PROCTOFOAM HC 1-1 % EX FOAM: 1.0000 | Freq: Three times a day (TID) | CUTANEOUS | 0 refills | Status: DC

## 2020-07-08 MED FILL — FAMOTIDINE 20 MG TABS: 20 | 30 days supply | Qty: 60 | Fill #0

## 2020-07-08 MED FILL — PROCTOFOAM-HC 1%-1% FOAM: 1-1 | 30 days supply | Qty: 10 | Fill #0

## 2020-07-08 NOTE — Progress Notes (Signed)
Misty Austin is a 35 y.o. female with the following history as recorded in EpicCare:  Patient Active Problem List   Diagnosis Date Noted  . Migraine without aura and with status migrainosus, not intractable 02/20/2018  . Indication for care in labor and delivery, antepartum 05/12/2015  . NSVD (normal spontaneous vaginal delivery) 01/29/2013  . ABDOMINAL PAIN OTHER SPECIFIED SITE 07/31/2009    Current Outpatient Medications  Medication Sig Dispense Refill  . escitalopram (LEXAPRO) 10 MG tablet Take 10 mg by mouth daily.    . famotidine (PEPCID) 20 MG tablet Take 1 tablet (20 mg total) by mouth 2 (two) times daily. 60 tablet 1  . hydrocortisone-pramoxine (PROCTOFOAM HC) rectal foam Place 1 applicator rectally 3 (three) times daily. 10 g 0  . norgestrel-ethinyl estradiol (LO/OVRAL,CRYSELLE) 0.3-30 MG-MCG tablet Take 1 tablet by mouth daily.     No current facility-administered medications for this visit.    Allergies: Patient has no known allergies.  Past Medical History:  Diagnosis Date  . Anxiety and depression   . Congenital kidney disease    familial nephritis  . GERD (gastroesophageal reflux disease)   . 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 Hx of varicella   . IBS (irritable bowel syndrome)   . Thyroid nodule    benign folliculr on bx 12/4330  . Vaginal Pap smear, abnormal     Past Surgical History:  Procedure Laterality Date  . LAPAROSCOPIC CHOLECYSTECTOMY  09/2009   [facility]  . WISDOM TOOTH EXTRACTION      Family History  Problem Relation Age of Onset  . Esophageal cancer Maternal Grandfather   . Diabetes Maternal Grandmother   . Breast cancer Maternal Grandmother   . Lymphoma Paternal Grandfather   . Leukemia Paternal Grandfather   . Diabetes Paternal Grandmother   . Kidney disease Mother        familial nephritis  . Melanoma Mother 23  . Basal cell carcinoma Mother     Social History   Tobacco Use  . Smoking status: Never  Smoker  . Smokeless tobacco: Never Used  Substance Use Topics  . Alcohol use: No    Subjective:  Patient had her CPE with GYN in September 2021; LFTs were noted to be slightly elevated; had COVID in August 2021 and did have to take increased amounts of Tylenol; was told to get her LFts re-checked in about 3 months; ( AST 38; ALT 20); denies any abdominal pain or changes in bowel movements;     Objective:  Vitals:   07/08/20 1233  BP: 110/76  Pulse: 74  Temp: 98.7 F (37.1 C)  TempSrc: Oral  SpO2: 98%  Weight: 160 lb (72.6 kg)  Height: 5' 5"  (1.651 m)    General: Well developed, well nourished, in no acute distress  Skin : Warm and dry.  Head: Normocephalic and atraumatic  Lungs: Respirations unlabored;  Neurologic: Alert and oriented; speech intact; face symmetrical; moves all extremities well; CNII-XII intact without focal deficit   Assessment:  1. Low vitamin B12 level   2. Other fatigue   3. Hemorrhoids, unspecified hemorrhoid type   4. Gastroesophageal reflux disease, unspecified whether esophagitis present     Plan:  1. Check B12 level today; has been feeling better since starting treatment; 2. Check CMP today to re-check LFTs;  3. Trial of Proctofoam HC apply tid; 4. Rx for Pepcid 20 mg bid prn;  This visit occurred during the SARS-CoV-2 public health emergency.  Safety protocols were in place, including screening questions prior to the visit, additional usage of staff PPE, and extensive cleaning of exam room while observing appropriate contact time as indicated for disinfecting solutions.     No follow-ups on file.  Orders Placed This Encounter  Procedures  . Comp Met (CMET)    Standing Status:   Future    Number of Occurrences:   1    Standing Expiration Date:   07/08/2021  . B12    Standing Status:   Future    Number of Occurrences:   1    Standing Expiration Date:   07/08/2021    Requested Prescriptions   Signed Prescriptions Disp Refills  . famotidine  (PEPCID) 20 MG tablet 60 tablet 1    Sig: Take 1 tablet (20 mg total) by mouth 2 (two) times daily.  . hydrocortisone-pramoxine (PROCTOFOAM HC) rectal foam 10 g 0    Sig: Place 1 applicator rectally 3 (three) times daily.

## 2020-07-09 ENCOUNTER — Other Ambulatory Visit: Payer: Self-pay | Admitting: Family

## 2020-07-09 ENCOUNTER — Encounter: Payer: Self-pay | Admitting: Family

## 2020-07-09 DIAGNOSIS — K5900 Constipation, unspecified: Secondary | ICD-10-CM

## 2020-07-09 DIAGNOSIS — E538 Deficiency of other specified B group vitamins: Secondary | ICD-10-CM

## 2020-07-10 ENCOUNTER — Encounter: Payer: Self-pay | Admitting: Gastroenterology

## 2020-07-14 MED FILL — ELINEST 0.3-30 MG-MCG TABS: 0.3-30 | 84 days supply | Qty: 84 | Fill #2

## 2020-07-14 MED FILL — ESCITALOPRAM 10 MG TABLET: 10 | 90 days supply | Qty: 90 | Fill #1

## 2020-07-15 ENCOUNTER — Other Ambulatory Visit: Payer: Self-pay | Admitting: Family

## 2020-07-15 MED ORDER — CYANOCOBALAMIN 1000 MCG/ML IJ SOLN: INTRAMUSCULAR | 0 refills | Status: DC

## 2020-07-15 MED ORDER — "BD ECLIPSE SYRINGE 22G X 1"" 3 ML MISC": 0 refills | Status: DC

## 2020-07-15 MED FILL — CYANOCOBALAMIN 1,000 MCG/ML: 1000 | 28 days supply | Qty: 4 | Fill #0

## 2020-07-15 MED FILL — BD 3 ML SYRINGE WITH NEEDLE: 22G X 1" | 90 days supply | Qty: 10 | Fill #0

## 2020-08-20 ENCOUNTER — Ambulatory Visit: Payer: 59 | Admitting: Gastroenterology

## 2020-08-20 ENCOUNTER — Encounter: Payer: Self-pay | Admitting: Gastroenterology

## 2020-08-20 DIAGNOSIS — R1310 Dysphagia, unspecified: Secondary | ICD-10-CM

## 2020-08-20 DIAGNOSIS — K219 Gastro-esophageal reflux disease without esophagitis: Secondary | ICD-10-CM

## 2020-08-20 DIAGNOSIS — R140 Abdominal distension (gaseous): Secondary | ICD-10-CM

## 2020-08-20 DIAGNOSIS — H16141 Punctate keratitis, right eye: Secondary | ICD-10-CM | POA: Diagnosis not present

## 2020-08-20 MED ORDER — FAMOTIDINE 20 MG PO TABS: 20.0000 mg | ORAL_TABLET | Freq: Every day | ORAL | 1 refills | Status: DC

## 2020-08-20 NOTE — Progress Notes (Signed)
HPI: This is a very pleasant 35 year old who was referred to me by Marrian Salvage,*  to evaluate GERD, dysphagia, low B12.    Her B12 level has been checked recently and is slowly dropping. She also has a variety of upper GI symptoms including GERD which seems to be getting worse lately. She describes pyrosis and also dysphagia to solids about once or twice per month. She is on Pepcid 20 mg which she takes most days of the week either in the morning before breakfast or sometimes at bedtime.  She is periodically bothered by constipation. She never sees blood in her stool.  She has trouble losing weight and has actually gained weight, about 6 pounds in the past 5 months.  She has a lot of bloating. This was particularly bad before her gallbladder was removed but is now getting bothersome again.  Old Data Reviewed:  Lab work January 2022 shows normal complete metabolic profile Lab work June 2021 shows normal CBC, ANA negative, sed rate normal.     Review of systems: Pertinent positive and negative review of systems were noted in the above HPI section. All other review negative.   Past Medical History:  Diagnosis Date  . Anxiety and depression   . Congenital kidney disease    familial nephritis  . GERD (gastroesophageal reflux disease)   . 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 Hx of varicella   . IBS (irritable bowel syndrome)   . Thyroid nodule    benign folliculr on bx 0/6237  . Vaginal Pap smear, abnormal     Past Surgical History:  Procedure Laterality Date  . LAPAROSCOPIC CHOLECYSTECTOMY  09/2009   [facility]  . WISDOM TOOTH EXTRACTION      Current Outpatient Medications  Medication Sig Dispense Refill  . cyanocobalamin (,VITAMIN B-12,) 1000 MCG/ML injection Inject 1 ml weekly as directed x 4 weeks; then monthly for maintenance. 10 mL 0  . escitalopram (LEXAPRO) 10 MG tablet Take 10 mg by mouth daily.    . famotidine (PEPCID) 20 MG  tablet Take 1 tablet (20 mg total) by mouth 2 (two) times daily. 60 tablet 1  . hydrocortisone-pramoxine (PROCTOFOAM HC) rectal foam Place 1 applicator rectally 3 (three) times daily. 10 g 0  . norgestrel-ethinyl estradiol (LO/OVRAL,CRYSELLE) 0.3-30 MG-MCG tablet Take 1 tablet by mouth daily.    . SYRINGE-NEEDLE, DISP, 3 ML (BD ECLIPSE SYRINGE) 22G X 1" 3 ML MISC Use for B12 injections 10 each 0   No current facility-administered medications for this visit.    Allergies as of 08/20/2020  . (No Known Allergies)    Family History  Problem Relation Age of Onset  . Esophageal cancer Maternal Grandfather   . Diabetes Maternal Grandmother   . Breast cancer Maternal Grandmother   . Lymphoma Paternal Grandfather   . Leukemia Paternal Grandfather   . Diabetes Paternal Grandmother   . Kidney disease Mother        familial nephritis  . Melanoma Mother 74  . Basal cell carcinoma Mother     Social History   Socioeconomic History  . Marital status: Married    Spouse name: Not on file  . Number of children: 0  . Years of education: Not on file  . Highest education level: Not on file  Occupational History  . Occupation: Optician, dispensing: [employer]  Tobacco Use  . Smoking status: Never Smoker  . Smokeless tobacco: Never  Used  Vaping Use  . Vaping Use: Never used  Substance and Sexual Activity  . Alcohol use: No  . Drug use: No  . Sexual activity: Yes  Other Topics Concern  . 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   From [place]   Lives with spouse and child   Social Determinants of Health   Financial Resource Strain: Not on file  Food Insecurity: Not on file  Transportation Needs: Not on file  Physical Activity: Not on file  Stress: Not on file  Social Connections: Not on file  Intimate Partner Violence: Not on file     Physical Exam: BP 100/70   Pulse 85   Ht 5\' 5"  (1.651 m)   Wt 164 lb (74.4 kg)   BMI 27.29 kg/m   Constitutional: generally well-appearing Psychiatric: alert and oriented x3 Eyes: extraocular movements intact Mouth: oral pharynx moist, no lesions Neck: supple no lymphadenopathy Cardiovascular: heart regular rate and rhythm Lungs: clear to auscultation bilaterally Abdomen: soft, nontender, nondistended, no obvious ascites, no peritoneal signs, normal bowel sounds Extremities: no lower extremity edema bilaterally Skin: no lesions on visible extremities   Assessment and plan: 35 y.o. female with low B12, GERD, intermittent dysphagia, weight gain  With her GERD and dysphagia I recommended upper endoscopy at her soonest convenience to exclude significant acid related damage, check for peptic stricturing or Schatzki's ring, eosinophilic esophagitis. I will also plan to biopsy her duodenum to check for celiac sprue which if she had could potentially contribute to B12 deficiency.  I recommended that she stay on her H2 blocker Pepcid 20 mg daily but that she try to take it every single day, bedtime dosing it is probably best.  Please see the "Patient Instructions" section for addition details about the plan.   Owens Loffler, MD [facility] 08/20/2020, 2:36 PM  Cc: Marrian Salvage,*  Total time on date of encounter was 45  minutes (this included time spent preparing to see the patient reviewing records; obtaining and/or reviewing separately obtained history; performing a medically appropriate exam and/or evaluation; counseling and educating the patient and family if present; ordering medications, tests or procedures if applicable; and documenting clinical information in the health record).

## 2020-08-20 NOTE — Patient Instructions (Signed)
If you are age 35 or younger, your body mass index should be between 19-25. Your Body mass index is 27.29 kg/m. If this is out of the aformentioned range listed, please consider follow up with your Primary Care Provider.   You have been scheduled for an endoscopy. Please follow written instructions given to you at your visit today. If you use inhalers (even only as needed), please bring them with you on the day of your procedure.  Due to recent changes in healthcare laws, you may see the results of your imaging and laboratory studies on MyChart before your provider has had a chance to review them.  We understand that in some cases there may be results that are confusing or concerning to you. Not all laboratory results come back in the same time frame and the provider may be waiting for multiple results in order to interpret others.  Please give [place] 48 hours in order for your provider to thoroughly review all the results before contacting the office for clarification of your results.   Please take Pepcid at bedtime each night.  Thank you for entrusting me with your care and choosing [facility].  Dr Ardis Hughs

## 2020-08-25 ENCOUNTER — Encounter: Payer: 59 | Admitting: Gastroenterology

## 2020-08-26 NOTE — Addendum Note (Signed)
Addended by: Stevan Born on: 08/26/2020 08:25 AM   Modules accepted: Orders

## 2020-08-28 ENCOUNTER — Encounter: Payer: Self-pay | Admitting: Gastroenterology

## 2020-09-02 ENCOUNTER — Other Ambulatory Visit: Payer: Self-pay

## 2020-09-02 ENCOUNTER — Encounter: Payer: Self-pay | Admitting: Gastroenterology

## 2020-09-02 ENCOUNTER — Ambulatory Visit (AMBULATORY_SURGERY_CENTER): Payer: 59 | Admitting: Gastroenterology

## 2020-09-02 VITALS — BP 137/71 | HR 65 | Temp 97.1°F | Resp 10 | Ht 65.0 in | Wt 164.0 lb

## 2020-09-02 DIAGNOSIS — K2100 Gastro-esophageal reflux disease with esophagitis, without bleeding: Secondary | ICD-10-CM | POA: Diagnosis not present

## 2020-09-02 DIAGNOSIS — R1310 Dysphagia, unspecified: Secondary | ICD-10-CM

## 2020-09-02 DIAGNOSIS — K219 Gastro-esophageal reflux disease without esophagitis: Secondary | ICD-10-CM

## 2020-09-02 DIAGNOSIS — K200 Eosinophilic esophagitis: Secondary | ICD-10-CM | POA: Diagnosis not present

## 2020-09-02 DIAGNOSIS — R140 Abdominal distension (gaseous): Secondary | ICD-10-CM

## 2020-09-02 MED ORDER — SODIUM CHLORIDE 0.9 % IV SOLN: 500.0000 mL | Freq: Once | INTRAVENOUS | Status: DC

## 2020-09-02 NOTE — Op Note (Signed)
[facility] Patient Name: Misty Austin Procedure Date: 09/02/2020 10:05 AM MRN: [id number] Endoscopist: Milus Banister , MD Age: 34 Referring MD:  Date of Birth: 1985/08/12 Gender: Female Account #: [id number] Procedure:                Upper GI endoscopy Indications:              Dyspepsia, Dysphagia Medicines:                Monitored Anesthesia Care Procedure:                Pre-Anesthesia Assessment:                           - Prior to the procedure, a History and Physical                            was performed, and patient medications and                            allergies were reviewed. The patient's tolerance of                            previous anesthesia was also reviewed. The risks                            and benefits of the procedure and the sedation                            options and risks were discussed with the patient.                            All questions were answered, and informed consent                            was obtained. Prior Anticoagulants: The patient has                            taken no previous anticoagulant or antiplatelet                            agents. ASA Grade Assessment: II - A patient with                            mild systemic disease. After reviewing the risks                            and benefits, the patient was deemed in                            satisfactory condition to undergo the procedure.                           After obtaining informed consent, the endoscope was  passed under direct vision. Throughout the                            procedure, the patient's blood pressure, pulse, and                            oxygen saturations were monitored continuously. The                            Endoscope was introduced through the mouth, and                            advanced to the second part of duodenum. The upper                            GI endoscopy was accomplished  without difficulty.                            The patient tolerated the procedure well. Scope In: Scope Out: Findings:                 The esophagus was normal. Biopsies were taken from                            distal and proximal esophagus and sent to pathology.                           The stomach was normal.                           The examined duodenum was normal. Biopsies were                            taken to check for Celiac Sprue. Complications:            No immediate complications. Estimated blood loss:                            None. Estimated Blood Loss:     Estimated blood loss: none. Impression:               - Normal UGI tract. Biopsies taken from the                            esophagus and the duodenum to check for                            Eosinophilic Esophagitis and Celiac Sprue. Recommendation:           - Patient has a contact number available for                            emergencies. The signs and symptoms of potential                            delayed complications were discussed  with the                            patient. Return to normal activities tomorrow.                            Written discharge instructions were provided to the                            patient.                           - Resume previous diet.                           - Continue present medications.                           - Await pathology results. Milus Banister, MD 09/02/2020 10:23:16 AM This report has been signed electronically.

## 2020-09-02 NOTE — Progress Notes (Signed)
VS by CW. ?

## 2020-09-02 NOTE — Patient Instructions (Signed)
Await pathology results of biopsies taken today.  Resume previous diet and medications.  YOU HAD AN ENDOSCOPIC PROCEDURE TODAY AT [facility]:   Refer to the procedure report that was given to you for any specific questions about what was found during the examination.  If the procedure report does not answer your questions, please call your gastroenterologist to clarify.  If you requested that your care partner not be given the details of your procedure findings, then the procedure report has been included in a sealed envelope for you to review at your convenience later.  YOU SHOULD EXPECT: Some feelings of bloating in the abdomen. Passage of more gas than usual.  Walking can help get rid of the air that was put into your GI tract during the procedure and reduce the bloating. If you had a lower endoscopy (such as a colonoscopy or flexible sigmoidoscopy) you may notice spotting of blood in your stool or on the toilet paper. If you underwent a bowel prep for your procedure, you may not have a normal bowel movement for a few days.  Please Note:  You might notice some irritation and congestion in your nose or some drainage.  This is from the oxygen used during your procedure.  There is no need for concern and it should clear up in a day or so.  SYMPTOMS TO REPORT IMMEDIATELY:    Following upper endoscopy (EGD)  Vomiting of blood or coffee ground material  New chest pain or pain under the shoulder blades  Painful or persistently difficult swallowing  New shortness of breath  Fever of 100F or higher  Black, tarry-looking stools  For urgent or emergent issues, a gastroenterologist can be reached at any hour by calling [phone]. Do not use MyChart messaging for urgent concerns.    DIET:  We do recommend a small meal at first, but then you may proceed to your regular diet.  Drink plenty of fluids but you should avoid alcoholic beverages for 24 hours.  ACTIVITY:  You should  plan to take it easy for the rest of today and you should NOT DRIVE or use heavy machinery until tomorrow (because of the sedation medicines used during the test).    FOLLOW UP: Our staff will call the number listed on your records 48-72 hours following your procedure to check on you and address any questions or concerns that you may have regarding the information given to you following your procedure. If we do not reach you, we will leave a message.  We will attempt to reach you two times.  During this call, we will ask if you have developed any symptoms of COVID 19. If you develop any symptoms (ie: fever, flu-like symptoms, shortness of breath, cough etc.) before then, please call [phone].  If you test positive for Covid 19 in the 2 weeks post procedure, please call and report this information to [place].    If any biopsies were taken you will be contacted by phone or by letter within the next 1-3 weeks.  Please call us at [phone] if you have not heard about the biopsies in 3 weeks.    SIGNATURES/CONFIDENTIALITY: You and/or your care partner have signed paperwork which will be entered into your electronic medical record.  These signatures attest to the fact that that the information above on your After Visit Summary has been reviewed and is understood.  Full responsibility of the confidentiality of this discharge information lies with you and/or your  care-partner.

## 2020-09-02 NOTE — Progress Notes (Signed)
1010 Robinul 0.1 mg IV given due large amount of secretions upon assessment.  MD made aware, vss 

## 2020-09-02 NOTE — Progress Notes (Signed)
Report given to PACU, vss 

## 2020-09-02 NOTE — Progress Notes (Signed)
Called to room to assist during endoscopic procedure.  Patient ID and intended procedure confirmed with present staff. Received instructions for my participation in the procedure from the performing physician.  

## 2020-09-04 ENCOUNTER — Telehealth: Payer: Self-pay | Admitting: *Deleted

## 2020-09-04 NOTE — Telephone Encounter (Signed)
  Follow up Call-  Call back number 09/02/2020  Post procedure Call Back phone  # [phone]  Permission to leave phone message Yes  Some recent data might be hidden     Patient questions:  Do you have a fever, pain , or abdominal swelling? No. Pain Score  0 *  Have you tolerated food without any problems? Yes.    Have you been able to return to your normal activities? Yes.    Do you have any questions about your discharge instructions: Diet   No. Medications  No. Follow up visit  No.  Do you have questions or concerns about your Care? No.  Actions: * If pain score is 4 or above: No action needed, pain <4.

## 2020-09-05 ENCOUNTER — Telehealth: Payer: 59 | Admitting: Physician Assistant

## 2020-09-05 ENCOUNTER — Encounter: Payer: Self-pay | Admitting: Physician Assistant

## 2020-09-05 DIAGNOSIS — R059 Cough, unspecified: Secondary | ICD-10-CM

## 2020-09-05 DIAGNOSIS — J040 Acute laryngitis: Secondary | ICD-10-CM | POA: Diagnosis not present

## 2020-09-05 DIAGNOSIS — Z20822 Contact with and (suspected) exposure to covid-19: Secondary | ICD-10-CM

## 2020-09-05 MED ORDER — PSEUDOEPH-BROMPHEN-DM 30-2-10 MG/5ML PO SYRP: 5.0000 mL | ORAL_SOLUTION | Freq: Three times a day (TID) | ORAL | 0 refills | Status: DC | PRN

## 2020-09-05 MED ORDER — AMOXICILLIN 500 MG PO CAPS: 500.0000 mg | ORAL_CAPSULE | Freq: Two times a day (BID) | ORAL | 0 refills | Status: DC

## 2020-09-05 NOTE — Progress Notes (Signed)
E-Visit for Corona Virus Screening  Your current symptoms could be consistent with the coronavirus.  Many health care providers can now test patients at their office but not all are.  [facility] has multiple testing sites. For information on our [place] testing locations and hours go to [email or web address]  We are enrolling you in our [facility] for [place] . Daily you will receive a questionnaire within the [facility] website. Our COVID 19 response team will be monitoring your responses daily.  Testing Information: The COVID-19 Community Testing sites are testing BY APPOINTMENT ONLY.  You can schedule online at [email or web address]  If you do not have access to a smart phone or computer you may call [phone] for an appointment.   Additional testing sites in the Community:  . For [facility] Testing sites in [facility]  FaceUpdate.[email or web address]  . For Pop-up testing sites in [place]  BowlDirectory.[email or web address]  . For [facility] [email or web address]  . For [facility] testing in [place] [facility] [employer] [email or web address]  . For [facility] testing in [facility]   [email or web address]  For  more information about community testing call [phone]   Please quarantine yourself while awaiting your test results. Please stay home for a minimum of 10 days from the first day of illness with improving symptoms and you have had 24 hours of no fever (without the use of Tylenol (Acetaminophen) Motrin (Ibuprofen) or any fever reducing medication).  Also - Do not get tested prior to returning to work because once you have had a positive test the test can stay  positive for more than a month in some cases.   You should wear a mask or cloth face covering over your nose and mouth if you must be around other people or animals, including pets (even at home). Try to stay at least 6 feet away from other people. This will protect the people around you.  Please continue good preventive care measures, including:  frequent hand-washing, avoid touching your face, cover coughs/sneezes, stay out of crowds and keep a 6 foot distance from others.  COVID-19 is a respiratory illness with symptoms that are similar to the flu. Symptoms are typically mild to moderate, but there have been cases of severe illness and death due to the virus.   The following symptoms may appear 2-14 days after exposure: . Fever . Cough . Shortness of breath or difficulty breathing . Chills . Repeated shaking with chills . Muscle pain . Headache . Sore throat . New loss of taste or smell . Fatigue . Congestion or runny nose . Nausea or vomiting . Diarrhea  Go to the nearest hospital ED for assessment if fever/cough/breathlessness are severe or illness seems like a threat to life.  It is vitally important that if you feel that you have an infection such as this virus or any other virus that you stay home and away from places where you may spread it to others.  You should avoid contact with people age 14 and older.   You can use medication such as Brompheniramine for cough- take 5 mls three times daily as needed for cough.    You may also take acetaminophen (Tylenol) as needed for fever.  Reduce your risk of any infection by using the same precautions used for avoiding the common cold or flu:  Marland Kitchen Wash your hands often with soap and warm water for at least 20 seconds.  If soap and water are not readily available, use an alcohol-based hand  sanitizer with at least 60% alcohol.  . If coughing or sneezing, cover your mouth and nose by coughing or sneezing into the elbow areas of your shirt or  coat, into a tissue or into your sleeve (not your hands). . Avoid shaking hands with others and consider head nods or verbal greetings only. . Avoid touching your eyes, nose, or mouth with unwashed hands.  . Avoid close contact with people who are sick. . Avoid places or events with large numbers of people in one location, like concerts or sporting events. . Carefully consider travel plans you have or are making. . If you are planning any travel outside or inside the [place], visit the CDC's Travelers' Health webpage for the latest health notices. . If you have some symptoms but not all symptoms, continue to monitor at home and seek medical attention if your symptoms worsen. . If you are having a medical emergency, call 911.  HOME CARE . Only take medications as instructed by your medical team. . Drink plenty of fluids and get plenty of rest. . A steam or ultrasonic humidifier can help if you have congestion.   GET HELP RIGHT AWAY IF YOU HAVE EMERGENCY WARNING SIGNS** FOR COVID-19. If you or someone is showing any of these signs seek emergency medical care immediately. Call 911 or proceed to your closest emergency facility if: . You develop worsening high fever. . Trouble breathing . Bluish lips or face . Persistent pain or pressure in the chest . New confusion . Inability to wake or stay awake . You cough up blood. . Your symptoms become more severe  **This list is not all possible symptoms. Contact your medical provider for any symptoms that are sever or concerning to you.  MAKE SURE YOU   Understand these instructions.  Will watch your condition.  Will get help right away if you are not doing well or get worse.  Your e-visit answers were reviewed by a board certified advanced clinical practitioner to complete your personal care plan.  Depending on the condition, your plan could have included both over the counter or prescription medications.  If there is a problem please reply once  you have received a response from your provider.  Your safety is important to [place].  If you have drug allergies check your prescription carefully.    You can use MyChart to ask questions about today's visit, request a non-urgent call back, or ask for a work or school excuse for 24 hours related to this e-Visit. If it has been greater than 24 hours you will need to follow up with your provider, or enter a new e-Visit to address those concerns. You will get an e-mail in the next two days asking about your experience.  I hope that your e-visit has been valuable and will speed your recovery. Thank you for using e-visits.   I spent 5-10 minutes on review and completion of this note- Lacy Duverney [facility]

## 2020-09-09 MED FILL — CYANOCOBALAMIN 1,000 MCG/ML: 1000 | 28 days supply | Qty: 4 | Fill #1

## 2020-09-16 DIAGNOSIS — H5203 Hypermetropia, bilateral: Secondary | ICD-10-CM | POA: Diagnosis not present

## 2020-09-21 ENCOUNTER — Other Ambulatory Visit: Payer: Self-pay | Admitting: Gastroenterology

## 2020-09-21 ENCOUNTER — Other Ambulatory Visit: Payer: Self-pay

## 2020-09-21 MED ORDER — OMEPRAZOLE 40 MG PO CPDR: 40.0000 mg | DELAYED_RELEASE_CAPSULE | Freq: Every day | ORAL | 6 refills | Status: DC

## 2020-09-21 MED FILL — OMEPRAZOLE 40 MG CPDR: 40 | 30 days supply | Qty: 30 | Fill #0

## 2020-10-06 ENCOUNTER — Telehealth: Payer: 59 | Admitting: Emergency Medicine

## 2020-10-06 ENCOUNTER — Other Ambulatory Visit (HOSPITAL_BASED_OUTPATIENT_CLINIC_OR_DEPARTMENT_OTHER): Payer: Self-pay

## 2020-10-06 DIAGNOSIS — J019 Acute sinusitis, unspecified: Secondary | ICD-10-CM

## 2020-10-06 DIAGNOSIS — B9689 Other specified bacterial agents as the cause of diseases classified elsewhere: Secondary | ICD-10-CM

## 2020-10-06 MED ORDER — DOXYCYCLINE HYCLATE 100 MG PO TABS
100.0000 mg | ORAL_TABLET | Freq: Two times a day (BID) | ORAL | 0 refills | Status: DC
  Filled 2020-10-06: qty 20, 10d supply, fill #0

## 2020-10-06 MED FILL — Omeprazole Cap Delayed Release 40 MG: ORAL | 30 days supply | Qty: 30 | Fill #0 | Status: AC

## 2020-10-06 NOTE — Progress Notes (Signed)
We are sorry that you are not feeling well.  Here is how we plan to help!  Based on what you have shared with me it looks like you have sinusitis.  Sinusitis is inflammation and infection in the sinus cavities of the head.  Based on your presentation I believe you most likely have Acute Bacterial Sinusitis.  This is an infection caused by bacteria and is treated with antibiotics. I have prescribed Doxycycline 100mg  by mouth twice a day for 10 days. You may use an oral decongestant such as Mucinex D or if you have glaucoma or high blood pressure use plain Mucinex. Saline nasal spray help and can safely be used as often as needed for congestion.  If you develop worsening sinus pain, fever or notice severe headache and vision changes, or if symptoms are not better after completion of antibiotic, please schedule an appointment with a health care provider.    Sinus infections are not as easily transmitted as other respiratory infection, however we still recommend that you avoid close contact with loved ones, especially the very young and elderly.  Remember to wash your hands thoroughly throughout the day as this is the number one way to prevent the spread of infection!  Home Care:  Only take medications as instructed by your medical team.  Complete the entire course of an antibiotic.  Do not take these medications with alcohol.  A steam or ultrasonic humidifier can help congestion.  You can place a towel over your head and breathe in the steam from hot water coming from a faucet.  Avoid close contacts especially the very young and the elderly.  Cover your mouth when you cough or sneeze.  Always remember to wash your hands.  Get Help Right Away If:  You develop worsening fever or sinus pain.  You develop a severe head ache or visual changes.  Your symptoms persist after you have completed your treatment plan.  Make sure you  Understand these instructions.  Will watch your  condition.  Will get help right away if you are not doing well or get worse.  Your e-visit answers were reviewed by a board certified advanced clinical practitioner to complete your personal care plan.  Depending on the condition, your plan could have included both over the counter or prescription medications.  If there is a problem please reply  once you have received a response from your provider.  Your safety is important to [place].  If you have drug allergies check your prescription carefully.    You can use MyChart to ask questions about today's visit, request a non-urgent call back, or ask for a work or school excuse for 24 hours related to this e-Visit. If it has been greater than 24 hours you will need to follow up with your provider, or enter a new e-Visit to address those concerns.  You will get an e-mail in the next two days asking about your experience.  I hope that your e-visit has been valuable and will speed your recovery. Thank you for using e-visits.   Approximately 5 minutes was spent documenting and reviewing patient's chart.

## 2020-10-06 NOTE — Addendum Note (Signed)
Addended by: Brunetta Jeans on: 10/06/2020 01:12 PM   Modules accepted: Orders

## 2020-10-21 ENCOUNTER — Other Ambulatory Visit (HOSPITAL_BASED_OUTPATIENT_CLINIC_OR_DEPARTMENT_OTHER): Payer: Self-pay

## 2020-10-21 MED FILL — Norgestrel & Ethinyl Estradiol Tab 0.3 MG-30 MCG: ORAL | 28 days supply | Qty: 28 | Fill #0 | Status: CN

## 2020-10-28 ENCOUNTER — Other Ambulatory Visit (HOSPITAL_BASED_OUTPATIENT_CLINIC_OR_DEPARTMENT_OTHER): Payer: Self-pay

## 2020-10-30 ENCOUNTER — Other Ambulatory Visit (HOSPITAL_BASED_OUTPATIENT_CLINIC_OR_DEPARTMENT_OTHER): Payer: Self-pay

## 2020-10-30 MED FILL — Norgestrel & Ethinyl Estradiol Tab 0.3 MG-30 MCG: ORAL | 84 days supply | Qty: 84 | Fill #0 | Status: AC

## 2020-11-05 ENCOUNTER — Encounter: Payer: Self-pay | Admitting: Family

## 2020-11-06 ENCOUNTER — Other Ambulatory Visit: Payer: Self-pay | Admitting: Family

## 2020-11-06 DIAGNOSIS — E559 Vitamin D deficiency, unspecified: Secondary | ICD-10-CM

## 2020-11-06 DIAGNOSIS — E538 Deficiency of other specified B group vitamins: Secondary | ICD-10-CM

## 2020-11-08 MED FILL — Escitalopram Oxalate Tab 10 MG (Base Equiv): ORAL | 90 days supply | Qty: 90 | Fill #0 | Status: AC

## 2020-11-09 ENCOUNTER — Other Ambulatory Visit (HOSPITAL_BASED_OUTPATIENT_CLINIC_OR_DEPARTMENT_OTHER): Payer: Self-pay

## 2020-11-10 ENCOUNTER — Other Ambulatory Visit (HOSPITAL_BASED_OUTPATIENT_CLINIC_OR_DEPARTMENT_OTHER): Payer: Self-pay

## 2020-11-10 ENCOUNTER — Other Ambulatory Visit: Payer: Self-pay | Admitting: Family

## 2020-11-10 ENCOUNTER — Other Ambulatory Visit (INDEPENDENT_AMBULATORY_CARE_PROVIDER_SITE_OTHER): Payer: 59

## 2020-11-10 DIAGNOSIS — E538 Deficiency of other specified B group vitamins: Secondary | ICD-10-CM | POA: Diagnosis not present

## 2020-11-10 DIAGNOSIS — E559 Vitamin D deficiency, unspecified: Secondary | ICD-10-CM | POA: Diagnosis not present

## 2020-11-10 LAB — VITAMIN D 25 HYDROXY (VIT D DEFICIENCY, FRACTURES): VITD: 19.82 ng/mL — ABNORMAL LOW (ref 30.00–100.00)

## 2020-11-10 LAB — VITAMIN B12: Vitamin B-12: 416 pg/mL (ref 211–911)

## 2020-11-10 MED ORDER — VITAMIN D (ERGOCALCIFEROL) 1.25 MG (50000 UNIT) PO CAPS
50000.0000 [IU] | ORAL_CAPSULE | ORAL | 0 refills | Status: AC
  Filled 2020-11-10: qty 12, 84d supply, fill #0

## 2021-02-11 ENCOUNTER — Telehealth: Payer: 59 | Admitting: Family Medicine

## 2021-02-11 DIAGNOSIS — L089 Local infection of the skin and subcutaneous tissue, unspecified: Secondary | ICD-10-CM

## 2021-02-11 MED ORDER — MUPIROCIN 2 % EX OINT
1.0000 | TOPICAL_OINTMENT | Freq: Two times a day (BID) | CUTANEOUS | 0 refills | Status: DC
Start: 2021-02-11 — End: 2021-10-21

## 2021-02-11 NOTE — Progress Notes (Signed)
E-Visit for Simple Cut/Laceration  We are sorry that you have had an injury. Here is how we plan to help!  Based on what you shared with me it looks like you have a simple laceration that does not need to be repaired with stitches or tissue glue.   That said, I think we should have you use coverage for MRSA topically. I will order Mupirocin for twice daily use.   HOME CARE: Clean the cut or scrape - Wash it well with soap and water. * avoid using hydrogen peroxide which may cause tissue damage, or impede wound healing.  Stop the bleeding - If your cut or scrape is bleeding, press a clean cloth or bandage firmly on the area for 20 minutes. You can also help slow the bleeding by holding the cut above the level of your heart.   Put a thin layer of Bacitracin antibiotic ointment on the cut or scrape. (this can be purchased at any local pharmacy- ask your pharmacist if you need assistance)   Cover the cut or scrape with a bandage or gauze. Keep the bandage clean and dry. Change the bandage 1 to 2 times every day until your cut or scrape heals.   Watch for signs that your cut or scrape is infected (redness, drainage, pain, warmth, swelling or fever)  Over the next 48 hours your wound should start to improve with less pain, less swelling and less redness. If you should develop increasing pain, swelling, redness, fever, pus from the wound you should be seen immediately to make sure this is not becoming infected.   WOUND CARE: Please keep a layer of antibiotic ointment (bacitracin preferred) on this wound at least twice a day for the next seven days and keep a sterile dressing over top of it. You may gently clean the wound with warm soap and water between dressing changes.  We strongly recommend that you have a medical provider reevaluate your wound within 2 to 3 days in person to make sure that it is healing appropriately.  Thank you for choosing an e-visit.  Your e-visit answers were reviewed  by a board certified advanced clinical practitioner to complete your personal care plan. Depending upon the condition, your plan could have included both over the counter or prescription medications.  Please review your pharmacy choice. Make sure the pharmacy is open so you can pick up prescription now. If there is a problem, you may contact your provider through [employer] and have the prescription routed to another pharmacy.  Your safety is important to [place]. If you have drug allergies check your prescription carefully.   For the next 24 hours you can use MyChart to ask questions about today's visit, request a non-urgent call back, or ask for a work or school excuse. You will get an email in the next two days asking about your experience. I hope that your e-visit has been valuable and will speed your recovery.  I provided 5 minutes of non face-to-face time during this encounter for chart review, medication and order placement, as well as and documentation.

## 2021-02-16 ENCOUNTER — Other Ambulatory Visit: Payer: Self-pay

## 2021-02-16 ENCOUNTER — Other Ambulatory Visit (HOSPITAL_BASED_OUTPATIENT_CLINIC_OR_DEPARTMENT_OTHER): Payer: Self-pay

## 2021-02-16 ENCOUNTER — Ambulatory Visit: Payer: 59 | Admitting: Family

## 2021-02-16 ENCOUNTER — Encounter: Payer: Self-pay | Admitting: Family

## 2021-02-16 VITALS — BP 122/70 | HR 83 | Temp 98.2°F | Ht 65.0 in | Wt 161.2 lb

## 2021-02-16 DIAGNOSIS — E559 Vitamin D deficiency, unspecified: Secondary | ICD-10-CM

## 2021-02-16 DIAGNOSIS — M791 Myalgia, unspecified site: Secondary | ICD-10-CM | POA: Diagnosis not present

## 2021-02-16 DIAGNOSIS — E538 Deficiency of other specified B group vitamins: Secondary | ICD-10-CM

## 2021-02-16 DIAGNOSIS — F419 Anxiety disorder, unspecified: Secondary | ICD-10-CM | POA: Diagnosis not present

## 2021-02-16 LAB — COMPREHENSIVE METABOLIC PANEL
ALT: 13 U/L (ref 0–35)
AST: 13 U/L (ref 0–37)
Albumin: 4.1 g/dL (ref 3.5–5.2)
Alkaline Phosphatase: 52 U/L (ref 39–117)
BUN: 15 mg/dL (ref 6–23)
CO2: 28 mEq/L (ref 19–32)
Calcium: 9.3 mg/dL (ref 8.4–10.5)
Chloride: 103 mEq/L (ref 96–112)
Creatinine, Ser: 0.77 mg/dL (ref 0.40–1.20)
GFR: 100.28 mL/min (ref >60.00)
Glucose, Bld: 80 mg/dL (ref 70–99)
Potassium: 4.1 mEq/L (ref 3.5–5.1)
Sodium: 139 mEq/L (ref 135–145)
Total Bilirubin: 0.3 mg/dL (ref 0.2–1.2)
Total Protein: 6.6 g/dL (ref 6.0–8.3)

## 2021-02-16 LAB — CBC WITH DIFFERENTIAL/PLATELET
Basophils Absolute: 0 10*3/uL (ref 0.0–0.1)
Basophils Relative: 0.7 % (ref 0.0–3.0)
Eosinophils Absolute: 0.2 10*3/uL (ref 0.0–0.7)
Eosinophils Relative: 3.9 % (ref 0.0–5.0)
HCT: 36.8 % (ref 36.0–46.0)
Hemoglobin: 12.5 g/dL (ref 12.0–15.0)
Lymphocytes Relative: 45.7 % (ref 12.0–46.0)
Lymphs Abs: 2.2 10*3/uL (ref 0.7–4.0)
MCHC: 34 g/dL (ref 30.0–36.0)
MCV: 89.5 fl (ref 78.0–100.0)
Monocytes Absolute: 0.3 10*3/uL (ref 0.1–1.0)
Monocytes Relative: 6.2 % (ref 3.0–12.0)
Neutro Abs: 2.1 10*3/uL (ref 1.4–7.7)
Neutrophils Relative %: 43.5 % (ref 43.0–77.0)
Platelets: 269 10*3/uL (ref 150.0–400.0)
RBC: 4.11 Mil/uL (ref 3.87–5.11)
RDW: 12.3 % (ref 11.5–15.5)
WBC: 4.7 10*3/uL (ref 4.0–10.5)

## 2021-02-16 LAB — TSH: TSH: 1.15 u[IU]/mL (ref 0.35–5.50)

## 2021-02-16 LAB — VITAMIN D 25 HYDROXY (VIT D DEFICIENCY, FRACTURES): VITD: 41.03 ng/mL (ref 30.00–100.00)

## 2021-02-16 LAB — VITAMIN B12: Vitamin B-12: 233 pg/mL (ref 211–911)

## 2021-02-16 MED ORDER — ESCITALOPRAM OXALATE 20 MG PO TABS
20.0000 mg | ORAL_TABLET | Freq: Every day | ORAL | 1 refills | Status: DC
  Filled 2021-02-16: qty 90, 90d supply, fill #0
  Filled 2021-06-07: qty 90, 90d supply, fill #1

## 2021-02-16 NOTE — Progress Notes (Signed)
Misty Austin is a 35 y.o. female with the following history as recorded in EpicCare:  Patient Active Problem List   Diagnosis Date Noted   Migraine without aura and with status migrainosus, not intractable 02/20/2018   Indication for care in labor and delivery, antepartum 05/12/2015   NSVD (normal spontaneous vaginal delivery) 01/29/2013   ABDOMINAL PAIN OTHER SPECIFIED SITE 07/31/2009    Current Outpatient Medications  Medication Sig Dispense Refill   escitalopram (LEXAPRO) 10 MG tablet TAKE 1 TABLET BY MOUTH ONCE DAILY 90 tablet 3   escitalopram (LEXAPRO) 20 MG tablet Take 1 tablet (20 mg total) by mouth daily. 90 tablet 1   famotidine (PEPCID) 20 MG tablet Take 1 tablet (20 mg total) by mouth at bedtime. (Patient taking differently: Take 20 mg by mouth 2 (two) times daily.) 60 tablet 1   mupirocin ointment (BACTROBAN) 2 % Apply 1 application topically 2 (two) times daily. 22 g 0   norgestrel-ethinyl estradiol (LO/OVRAL) 0.3-30 MG-MCG tablet TAKE ONE TABLET BY MOUTH DAILY. 28 tablet 11   No current facility-administered medications for this visit.    Allergies: Patient has no known allergies.  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   Hx of varicella    IBS (irritable bowel syndrome)    Thyroid nodule    benign folliculr on bx 01/3418   Vaginal Pap smear, abnormal     Past Surgical History:  Procedure Laterality Date   LAPAROSCOPIC CHOLECYSTECTOMY  09/2009   thompson   WISDOM TOOTH EXTRACTION      Family History  Problem Relation Age of Onset   Esophageal cancer Maternal Grandfather    Diabetes Maternal Grandmother    Breast cancer Maternal Grandmother    Lymphoma Paternal Grandfather    Leukemia Paternal Grandfather    Diabetes Paternal Grandmother    Kidney disease Mother         familial nephritis   Melanoma Mother 23   Basal cell carcinoma Mother    Colon cancer Neg Hx    Stomach cancer Neg Hx    Rectal cancer Neg Hx     Social History   Tobacco Use   Smoking status: Never   Smokeless tobacco: Never  Substance Use Topics   Alcohol use: No    Subjective:   Increased problems with anxiety; has been on Lexapro x 6 years and historically done well; admits personal issues increased lately- loss of aunt/ father has recurrent cancer/ 2 children in K and 2nd grade; not sleeping well; not currently working with therapist- has done in the past;     Objective:  Vitals:   02/16/21 1056  BP: 122/70  Pulse: 83  Temp: 98.2 F (36.8 C)  TempSrc: Oral  SpO2: 99%  Weight: 161 lb 3.2 oz (73.1 kg)  Height: 5' 5"  (1.651 m)    General: Well developed, well nourished, in no acute distress  Skin : Warm and dry.  Head: Normocephalic and atraumatic  Eyes: Sclera and conjunctiva clear; pupils round and reactive to light; extraocular movements intact  Ears: External normal; canals clear; tympanic membranes normal  Oropharynx: Pink, supple. No suspicious lesions  Neck: Supple without thyromegaly, adenopathy  Lungs: Respirations unlabored;  Neurologic: Alert and oriented; speech intact; face symmetrical; moves all extremities well; CNII-XII intact without focal deficit  Assessment:  1. Myalgia   2. Vitamin D deficiency   3. Low vitamin B12 level   4. Anxiety     Plan:  Increase Lexapro to 20 mg daily; update labs today- may need to consider long-term Rx for prescriptive Vitamin D; will discuss changing OCP with GYN- could consider IUD;  If no improvement, to consider Cymbalta and/ or sleep agent;   This visit occurred during the SARS-CoV-2 public health emergency.  Safety protocols were in place, including screening questions prior to the visit, additional usage of staff PPE, and extensive cleaning of exam room while observing appropriate contact time as indicated  for disinfecting solutions.   Time spent 30 minutes  No follow-ups on file.  Orders Placed This Encounter  Procedures   CBC with Differential/Platelet   Comp Met (CMET)   TSH   Vitamin D (25 hydroxy)   B12    Requested Prescriptions   Signed Prescriptions Disp Refills   escitalopram (LEXAPRO) 20 MG tablet 90 tablet 1    Sig: Take 1 tablet (20 mg total) by mouth daily.

## 2021-02-18 ENCOUNTER — Encounter: Payer: Self-pay | Admitting: Family

## 2021-02-18 ENCOUNTER — Other Ambulatory Visit: Payer: Self-pay | Admitting: Family

## 2021-02-18 ENCOUNTER — Other Ambulatory Visit (HOSPITAL_BASED_OUTPATIENT_CLINIC_OR_DEPARTMENT_OTHER): Payer: Self-pay

## 2021-02-18 DIAGNOSIS — E538 Deficiency of other specified B group vitamins: Secondary | ICD-10-CM

## 2021-02-18 MED ORDER — VITAMIN D (ERGOCALCIFEROL) 1.25 MG (50000 UNIT) PO CAPS
50000.0000 [IU] | ORAL_CAPSULE | ORAL | 0 refills | Status: DC
  Filled 2021-02-18: qty 3, 90d supply, fill #0

## 2021-03-24 DIAGNOSIS — Z01419 Encounter for gynecological examination (general) (routine) without abnormal findings: Secondary | ICD-10-CM | POA: Diagnosis not present

## 2021-03-24 DIAGNOSIS — Z130 Encounter for screening for diseases of the blood and blood-forming organs and certain disorders involving the immune mechanism: Secondary | ICD-10-CM | POA: Diagnosis not present

## 2021-03-24 DIAGNOSIS — Z6827 Body mass index (BMI) 27.0-27.9, adult: Secondary | ICD-10-CM | POA: Diagnosis not present

## 2021-03-24 DIAGNOSIS — Z1389 Encounter for screening for other disorder: Secondary | ICD-10-CM | POA: Diagnosis not present

## 2021-03-24 DIAGNOSIS — N926 Irregular menstruation, unspecified: Secondary | ICD-10-CM | POA: Diagnosis not present

## 2021-03-24 DIAGNOSIS — R5383 Other fatigue: Secondary | ICD-10-CM | POA: Diagnosis not present

## 2021-04-13 DIAGNOSIS — M25551 Pain in right hip: Secondary | ICD-10-CM | POA: Diagnosis not present

## 2021-04-13 DIAGNOSIS — M9905 Segmental and somatic dysfunction of pelvic region: Secondary | ICD-10-CM | POA: Diagnosis not present

## 2021-04-13 DIAGNOSIS — M9903 Segmental and somatic dysfunction of lumbar region: Secondary | ICD-10-CM | POA: Diagnosis not present

## 2021-04-13 DIAGNOSIS — M5386 Other specified dorsopathies, lumbar region: Secondary | ICD-10-CM | POA: Diagnosis not present

## 2021-04-14 DIAGNOSIS — M5386 Other specified dorsopathies, lumbar region: Secondary | ICD-10-CM | POA: Diagnosis not present

## 2021-04-14 DIAGNOSIS — M9903 Segmental and somatic dysfunction of lumbar region: Secondary | ICD-10-CM | POA: Diagnosis not present

## 2021-04-14 DIAGNOSIS — M9905 Segmental and somatic dysfunction of pelvic region: Secondary | ICD-10-CM | POA: Diagnosis not present

## 2021-04-14 DIAGNOSIS — M25551 Pain in right hip: Secondary | ICD-10-CM | POA: Diagnosis not present

## 2021-04-15 DIAGNOSIS — M25551 Pain in right hip: Secondary | ICD-10-CM | POA: Diagnosis not present

## 2021-04-15 DIAGNOSIS — M9905 Segmental and somatic dysfunction of pelvic region: Secondary | ICD-10-CM | POA: Diagnosis not present

## 2021-04-15 DIAGNOSIS — M5386 Other specified dorsopathies, lumbar region: Secondary | ICD-10-CM | POA: Diagnosis not present

## 2021-04-15 DIAGNOSIS — M9903 Segmental and somatic dysfunction of lumbar region: Secondary | ICD-10-CM | POA: Diagnosis not present

## 2021-04-19 DIAGNOSIS — M5386 Other specified dorsopathies, lumbar region: Secondary | ICD-10-CM | POA: Diagnosis not present

## 2021-04-19 DIAGNOSIS — M25551 Pain in right hip: Secondary | ICD-10-CM | POA: Diagnosis not present

## 2021-04-19 DIAGNOSIS — M5459 Other low back pain: Secondary | ICD-10-CM | POA: Diagnosis not present

## 2021-04-19 DIAGNOSIS — M9903 Segmental and somatic dysfunction of lumbar region: Secondary | ICD-10-CM | POA: Diagnosis not present

## 2021-04-19 DIAGNOSIS — M9905 Segmental and somatic dysfunction of pelvic region: Secondary | ICD-10-CM | POA: Diagnosis not present

## 2021-04-21 DIAGNOSIS — M9905 Segmental and somatic dysfunction of pelvic region: Secondary | ICD-10-CM | POA: Diagnosis not present

## 2021-04-21 DIAGNOSIS — M25551 Pain in right hip: Secondary | ICD-10-CM | POA: Diagnosis not present

## 2021-04-21 DIAGNOSIS — M5386 Other specified dorsopathies, lumbar region: Secondary | ICD-10-CM | POA: Diagnosis not present

## 2021-04-21 DIAGNOSIS — M9903 Segmental and somatic dysfunction of lumbar region: Secondary | ICD-10-CM | POA: Diagnosis not present

## 2021-04-22 DIAGNOSIS — Q519 Congenital malformation of uterus and cervix, unspecified: Secondary | ICD-10-CM | POA: Diagnosis not present

## 2021-04-22 DIAGNOSIS — N939 Abnormal uterine and vaginal bleeding, unspecified: Secondary | ICD-10-CM | POA: Diagnosis not present

## 2021-04-23 ENCOUNTER — Other Ambulatory Visit: Payer: Self-pay | Admitting: Obstetrics and Gynecology

## 2021-04-23 DIAGNOSIS — Q519 Congenital malformation of uterus and cervix, unspecified: Secondary | ICD-10-CM

## 2021-05-04 DIAGNOSIS — M25551 Pain in right hip: Secondary | ICD-10-CM | POA: Diagnosis not present

## 2021-05-04 DIAGNOSIS — M9903 Segmental and somatic dysfunction of lumbar region: Secondary | ICD-10-CM | POA: Diagnosis not present

## 2021-05-04 DIAGNOSIS — M5386 Other specified dorsopathies, lumbar region: Secondary | ICD-10-CM | POA: Diagnosis not present

## 2021-05-04 DIAGNOSIS — M9905 Segmental and somatic dysfunction of pelvic region: Secondary | ICD-10-CM | POA: Diagnosis not present

## 2021-05-12 DIAGNOSIS — M25551 Pain in right hip: Secondary | ICD-10-CM | POA: Diagnosis not present

## 2021-05-12 DIAGNOSIS — M9905 Segmental and somatic dysfunction of pelvic region: Secondary | ICD-10-CM | POA: Diagnosis not present

## 2021-05-12 DIAGNOSIS — M9903 Segmental and somatic dysfunction of lumbar region: Secondary | ICD-10-CM | POA: Diagnosis not present

## 2021-05-12 DIAGNOSIS — M5386 Other specified dorsopathies, lumbar region: Secondary | ICD-10-CM | POA: Diagnosis not present

## 2021-05-17 ENCOUNTER — Other Ambulatory Visit: Payer: Self-pay

## 2021-05-17 ENCOUNTER — Ambulatory Visit
Admission: RE | Admit: 2021-05-17 | Discharge: 2021-05-17 | Disposition: A | Discharge status: Home / Self Care | Payer: 59 | Source: Ambulatory Visit | Attending: Obstetrics and Gynecology | Admitting: Obstetrics and Gynecology

## 2021-05-17 DIAGNOSIS — Q519 Congenital malformation of uterus and cervix, unspecified: Secondary | ICD-10-CM | POA: Diagnosis not present

## 2021-05-17 MED ORDER — GADOBENATE DIMEGLUMINE 529 MG/ML IV SOLN
15.0000 mL | Freq: Once | INTRAVENOUS | Status: AC | PRN
  Administered 2021-05-17: 15 mL via INTRAVENOUS

## 2021-05-19 DIAGNOSIS — M9903 Segmental and somatic dysfunction of lumbar region: Secondary | ICD-10-CM | POA: Diagnosis not present

## 2021-05-19 DIAGNOSIS — M25551 Pain in right hip: Secondary | ICD-10-CM | POA: Diagnosis not present

## 2021-05-19 DIAGNOSIS — M5386 Other specified dorsopathies, lumbar region: Secondary | ICD-10-CM | POA: Diagnosis not present

## 2021-05-19 DIAGNOSIS — M9905 Segmental and somatic dysfunction of pelvic region: Secondary | ICD-10-CM | POA: Diagnosis not present

## 2021-05-25 DIAGNOSIS — M9905 Segmental and somatic dysfunction of pelvic region: Secondary | ICD-10-CM | POA: Diagnosis not present

## 2021-05-25 DIAGNOSIS — M25551 Pain in right hip: Secondary | ICD-10-CM | POA: Diagnosis not present

## 2021-05-25 DIAGNOSIS — M9903 Segmental and somatic dysfunction of lumbar region: Secondary | ICD-10-CM | POA: Diagnosis not present

## 2021-05-25 DIAGNOSIS — M5386 Other specified dorsopathies, lumbar region: Secondary | ICD-10-CM | POA: Diagnosis not present

## 2021-05-30 ENCOUNTER — Telehealth: Payer: 59 | Admitting: Emergency Medicine

## 2021-05-30 DIAGNOSIS — R6889 Other general symptoms and signs: Secondary | ICD-10-CM | POA: Diagnosis not present

## 2021-05-30 MED ORDER — OSELTAMIVIR PHOSPHATE 75 MG PO CAPS: 75.0000 mg | ORAL_CAPSULE | Freq: Two times a day (BID) | ORAL | 0 refills | Status: AC

## 2021-05-30 NOTE — Progress Notes (Signed)
I have spent 5 minutes in review of e-visit questionnaire, review and updating patient chart, medical decision making and response to patient.   Julieann Drummonds, PA-C    

## 2021-05-30 NOTE — Progress Notes (Signed)
E visit for Flu like symptoms   We are sorry that you are not feeling well.  Here is how we plan to help!  Based on what you have shared with me it looks like you may have a respiratory virus that may be influenza.  It is not normal to experience chest pain or abdominal pain related to the flu.  Some people report discomfort in their chest with cough, but not constant pain.  If you are experiencing constant plan please disregard this e-visit and seek in person evaluation immediately at urgent care, the emergency room, or call 911.  Otherwise I will send in an antiviral medication that should help with your symptoms!  Influenza or "the flu" is   an infection caused by a respiratory virus. The flu virus is highly contagious and persons who did not receive their yearly flu vaccination may "catch" the flu from close contact.  We have anti-viral medications to treat the viruses that cause this infection. They are not a "cure" and only shorten the course of the infection. These prescriptions are most effective when they are given within the first 2 days of "flu" symptoms. Antiviral medication are indicated if you have a high risk of complications from the flu. You should  also consider an antiviral medication if you are in close contact with someone who is at risk. These medications can help patients avoid complications from the flu  but have side effects that you should know. Possible side effects from Tamiflu or oseltamivir include nausea, vomiting, diarrhea, dizziness, headaches, eye redness, sleep problems or other respiratory symptoms. You should not take Tamiflu if you have an allergy to oseltamivir or any to the ingredients in Tamiflu.  Based upon your symptoms and potential risk factors I have prescribed Oseltamivir (Tamiflu).  It has been sent to your designated pharmacy.  You will take one 75 mg capsule orally twice a day for the next 5 days.  ANYONE WHO HAS FLU SYMPTOMS SHOULD: Stay home. The  flu is highly contagious and going out or to work exposes others! Be sure to drink plenty of fluids. Water is fine as well as fruit juices, sodas and electrolyte beverages. You may want to stay away from caffeine or alcohol. If you are nauseated, try taking small sips of liquids. How do you know if you are getting enough fluid? Your urine should be a pale yellow or almost colorless. Get rest. Taking a steamy shower or using a humidifier may help nasal congestion and ease sore throat pain. Using a saline nasal spray works much the same way. Cough drops, hard candies and sore throat lozenges may ease your cough. Line up a caregiver. Have someone check on you regularly.   GET HELP RIGHT AWAY IF: You cannot keep down liquids or your medications. You become short of breath Your fell like you are going to pass out or loose consciousness. Your symptoms persist after you have completed your treatment plan MAKE SURE YOU  Understand these instructions. Will watch your condition. Will get help right away if you are not doing well or get worse.  Your e-visit answers were reviewed by a board certified advanced clinical practitioner to complete your personal care plan.  Depending on the condition, your plan could have included both over the counter or prescription medications.  If there is a problem please reply  once you have received a response from your provider.  Your safety is important to [place].  If you have drug allergies  check your prescription carefully.    You can use MyChart to ask questions about today's visit, request a non-urgent call back, or ask for a work or school excuse for 24 hours related to this e-Visit. If it has been greater than 24 hours you will need to follow up with your provider, or enter a new e-Visit to address those concerns.  You will get an e-mail in the next two days asking about your experience.  I hope that your e-visit has been valuable and will speed your recovery.  Thank you for using e-visits.

## 2021-06-07 ENCOUNTER — Other Ambulatory Visit (HOSPITAL_BASED_OUTPATIENT_CLINIC_OR_DEPARTMENT_OTHER): Payer: Self-pay

## 2021-06-07 ENCOUNTER — Other Ambulatory Visit: Payer: Self-pay

## 2021-06-09 ENCOUNTER — Other Ambulatory Visit: Payer: Self-pay

## 2021-06-09 ENCOUNTER — Other Ambulatory Visit (HOSPITAL_BASED_OUTPATIENT_CLINIC_OR_DEPARTMENT_OTHER): Payer: Self-pay

## 2021-06-10 ENCOUNTER — Other Ambulatory Visit (HOSPITAL_BASED_OUTPATIENT_CLINIC_OR_DEPARTMENT_OTHER): Payer: Self-pay

## 2021-06-10 DIAGNOSIS — M9903 Segmental and somatic dysfunction of lumbar region: Secondary | ICD-10-CM | POA: Diagnosis not present

## 2021-06-10 DIAGNOSIS — M25551 Pain in right hip: Secondary | ICD-10-CM | POA: Diagnosis not present

## 2021-06-10 DIAGNOSIS — M5386 Other specified dorsopathies, lumbar region: Secondary | ICD-10-CM | POA: Diagnosis not present

## 2021-06-10 DIAGNOSIS — M9905 Segmental and somatic dysfunction of pelvic region: Secondary | ICD-10-CM | POA: Diagnosis not present

## 2021-06-10 MED ORDER — ELINEST 0.3-30 MG-MCG PO TABS
1.0000 | ORAL_TABLET | Freq: Every day | ORAL | 2 refills | Status: DC
Start: 2021-06-10 — End: 2021-10-21
  Filled 2021-06-10: qty 84, 84d supply, fill #0

## 2021-06-15 DIAGNOSIS — M5386 Other specified dorsopathies, lumbar region: Secondary | ICD-10-CM | POA: Diagnosis not present

## 2021-06-15 DIAGNOSIS — M9903 Segmental and somatic dysfunction of lumbar region: Secondary | ICD-10-CM | POA: Diagnosis not present

## 2021-06-15 DIAGNOSIS — M25551 Pain in right hip: Secondary | ICD-10-CM | POA: Diagnosis not present

## 2021-06-15 DIAGNOSIS — M9905 Segmental and somatic dysfunction of pelvic region: Secondary | ICD-10-CM | POA: Diagnosis not present

## 2021-06-17 ENCOUNTER — Other Ambulatory Visit (HOSPITAL_BASED_OUTPATIENT_CLINIC_OR_DEPARTMENT_OTHER): Payer: Self-pay

## 2021-07-07 DIAGNOSIS — M5386 Other specified dorsopathies, lumbar region: Secondary | ICD-10-CM | POA: Diagnosis not present

## 2021-07-07 DIAGNOSIS — M9903 Segmental and somatic dysfunction of lumbar region: Secondary | ICD-10-CM | POA: Diagnosis not present

## 2021-07-07 DIAGNOSIS — M25551 Pain in right hip: Secondary | ICD-10-CM | POA: Diagnosis not present

## 2021-07-07 DIAGNOSIS — M9905 Segmental and somatic dysfunction of pelvic region: Secondary | ICD-10-CM | POA: Diagnosis not present

## 2021-08-12 ENCOUNTER — Telehealth: Payer: 59 | Admitting: Physician Assistant

## 2021-08-12 ENCOUNTER — Other Ambulatory Visit (HOSPITAL_BASED_OUTPATIENT_CLINIC_OR_DEPARTMENT_OTHER): Payer: Self-pay

## 2021-08-12 DIAGNOSIS — B9689 Other specified bacterial agents as the cause of diseases classified elsewhere: Secondary | ICD-10-CM

## 2021-08-12 DIAGNOSIS — J019 Acute sinusitis, unspecified: Secondary | ICD-10-CM

## 2021-08-12 MED ORDER — AMOXICILLIN-POT CLAVULANATE 875-125 MG PO TABS
1.0000 | ORAL_TABLET | Freq: Two times a day (BID) | ORAL | 0 refills | Status: DC
  Filled 2021-08-12: qty 14, 7d supply, fill #0

## 2021-08-12 NOTE — Progress Notes (Signed)

## 2021-09-02 DIAGNOSIS — N644 Mastodynia: Secondary | ICD-10-CM | POA: Diagnosis not present

## 2021-09-06 ENCOUNTER — Other Ambulatory Visit: Payer: Self-pay | Admitting: Obstetrics and Gynecology

## 2021-09-06 DIAGNOSIS — N644 Mastodynia: Secondary | ICD-10-CM

## 2021-10-04 ENCOUNTER — Ambulatory Visit
Admission: RE | Admit: 2021-10-04 | Discharge: 2021-10-04 | Disposition: A | Discharge status: Home / Self Care | Payer: 59 | Source: Ambulatory Visit | Attending: Obstetrics and Gynecology | Admitting: Obstetrics and Gynecology

## 2021-10-04 DIAGNOSIS — N644 Mastodynia: Secondary | ICD-10-CM | POA: Diagnosis not present

## 2021-10-04 DIAGNOSIS — R922 Inconclusive mammogram: Secondary | ICD-10-CM | POA: Diagnosis not present

## 2021-10-18 ENCOUNTER — Other Ambulatory Visit (HOSPITAL_BASED_OUTPATIENT_CLINIC_OR_DEPARTMENT_OTHER): Payer: Self-pay

## 2021-10-18 ENCOUNTER — Other Ambulatory Visit: Payer: Self-pay | Admitting: Family

## 2021-10-18 MED ORDER — ESCITALOPRAM OXALATE 20 MG PO TABS
20.0000 mg | ORAL_TABLET | Freq: Every day | ORAL | 1 refills | Status: DC
  Filled 2021-10-18: qty 90, 90d supply, fill #0

## 2021-10-21 ENCOUNTER — Telehealth (INDEPENDENT_AMBULATORY_CARE_PROVIDER_SITE_OTHER): Payer: 59 | Admitting: Family

## 2021-10-21 ENCOUNTER — Encounter: Payer: Self-pay | Admitting: Family

## 2021-10-21 VITALS — Ht 65.0 in | Wt 163.0 lb

## 2021-10-21 DIAGNOSIS — R635 Abnormal weight gain: Secondary | ICD-10-CM | POA: Diagnosis not present

## 2021-10-21 DIAGNOSIS — M791 Myalgia, unspecified site: Secondary | ICD-10-CM | POA: Diagnosis not present

## 2021-10-21 DIAGNOSIS — E538 Deficiency of other specified B group vitamins: Secondary | ICD-10-CM | POA: Diagnosis not present

## 2021-10-21 DIAGNOSIS — R140 Abdominal distension (gaseous): Secondary | ICD-10-CM | POA: Diagnosis not present

## 2021-10-21 DIAGNOSIS — E559 Vitamin D deficiency, unspecified: Secondary | ICD-10-CM

## 2021-10-21 NOTE — Progress Notes (Signed)
?Misty Austin is a 36 y.o. female with the following history as recorded in EpicCare:  ?Patient Active Problem List  ? Diagnosis Date Noted  ? Migraine without aura and with status migrainosus, not intractable 02/20/2018  ? Indication for care in labor and delivery, antepartum 05/12/2015  ? NSVD (normal spontaneous vaginal delivery) 01/29/2013  ? ABDOMINAL PAIN OTHER SPECIFIED SITE 07/31/2009  ?  ?Current Outpatient Medications  ?Medication Sig Dispense Refill  ? cholecalciferol (VITAMIN D3) 25 MCG (1000 UNIT) tablet Take 1,000 Units by mouth daily.    ? escitalopram (LEXAPRO) 20 MG tablet Take 1 tablet (20 mg total) by mouth daily. 90 tablet 1  ? ?No current facility-administered medications for this visit.  ?  ?Allergies: Patient has no known allergies.  ?Past Medical History:  ?Diagnosis Date  ? Anxiety and depression   ? Congenital kidney disease   ? familial nephritis  ? GERD (gastroesophageal reflux disease)   ? History of colposcopy with cervical biopsy 08/2012  ? +HPV  ? Hx of varicella   ? IBS (irritable bowel syndrome)   ? Thyroid nodule   ? benign folliculr on bx 0/8657  ? Vaginal Pap smear, abnormal   ?  ?Past Surgical History:  ?Procedure Laterality Date  ? LAPAROSCOPIC CHOLECYSTECTOMY  09/2009  ? thompson  ? WISDOM TOOTH EXTRACTION    ?  ?Family History  ?Problem Relation Age of Onset  ? Esophageal cancer Maternal Grandfather   ? Diabetes Maternal Grandmother   ? Breast cancer Maternal Grandmother   ? Lymphoma Paternal Grandfather   ? Leukemia Paternal Grandfather   ? Diabetes Paternal Grandmother   ? Kidney disease Mother   ?     familial nephritis  ? Melanoma Mother 64  ? Basal cell carcinoma Mother   ? Colon cancer Neg Hx   ? Stomach cancer Neg Hx   ? Rectal cancer Neg Hx   ?  ?Social History  ? ?Tobacco Use  ? Smoking status: Never  ? Smokeless tobacco: Never  ?Substance Use Topics  ? Alcohol use: No  ?  ?Subjective:  ? ? ?I connected with Misty Austin on 10/21/21 at  9:20 AM EDT  by a video enabled telemedicine application and verified that I am speaking with the correct person using two identifiers. ?  ?I discussed the limitations of evaluation and management by telemedicine and the availability of in person appointments. The patient expressed understanding and agreed to proceed. Provider in office/ patient is at home; provider and patient are only 2 people on video call.  ? ?Patient has been struggling with chronic myalgia/ joint pains/ anxiety "for years." Has been on Lexapro since 2016 but concerned it is not working as well; not sleeping well; decreased libido; does have known history of low Vitamin D and low B12; frustrated with inability to lose weight;  ?LMP 10/18/2020 ? ? ? ?Objective:  ?Vitals:  ? 10/21/21 0856  ?Weight: 163 lb (73.9 kg)  ?Height: 5' 5"  (1.651 m)  ?  ?General: Well developed, well nourished, in no acute distress  ?Skin : Warm and dry.  ?Head: Normocephalic and atraumatic  ?Lungs: Respirations unlabored;  ?Neurologic: Alert and oriented; speech intact; face symmetrical; moves all extremities well; CNII-XII intact without focal deficit  ? ?Assessment:  ?1. Myalgia   ?2. Abdominal bloating   ?3. Low vitamin B12 level   ?4. Vitamin D deficiency   ?5. Weight gain   ?  ?Plan:  ?Will update labs today; discussed changing  from Lexapro to Cymbalta and patient in agreement; will have patient start today- decrease to 10 mg;  ?Will also consider trial of weight loss medication once stable on Cymbalta; to also consider rheumatology referral;  ? ? ? ?No follow-ups on file.  ?Orders Placed This Encounter  ?Procedures  ? Antinuclear Antib (ANA)  ?  Standing Status:   Future  ?  Standing Expiration Date:   10/22/2022  ? Sedimentation rate  ?  Standing Status:   Future  ?  Standing Expiration Date:   10/22/2022  ? Rheumatoid Factor  ?  Standing Status:   Future  ?  Standing Expiration Date:   10/22/2022  ? Celiac Disease Panel  ?  Standing Status:   Future  ?  Standing Expiration Date:    10/22/2022  ? B12  ?  Standing Status:   Future  ?  Standing Expiration Date:   10/22/2022  ? Vitamin D (25 hydroxy)  ?  Standing Status:   Future  ?  Standing Expiration Date:   10/22/2022  ? CBC with Differential/Platelet  ?  Standing Status:   Future  ?  Standing Expiration Date:   10/22/2022  ? Comp Met (CMET)  ?  Standing Status:   Future  ?  Standing Expiration Date:   10/22/2022  ? TSH  ?  Standing Status:   Future  ?  Standing Expiration Date:   10/22/2022  ? Hemoglobin A1c  ?  Standing Status:   Future  ?  Standing Expiration Date:   10/22/2022  ?  ?Requested Prescriptions  ? ? No prescriptions requested or ordered in this encounter  ?  ? ?

## 2021-10-22 ENCOUNTER — Other Ambulatory Visit (HOSPITAL_BASED_OUTPATIENT_CLINIC_OR_DEPARTMENT_OTHER): Payer: Self-pay

## 2021-10-22 ENCOUNTER — Other Ambulatory Visit (INDEPENDENT_AMBULATORY_CARE_PROVIDER_SITE_OTHER): Payer: 59

## 2021-10-22 ENCOUNTER — Other Ambulatory Visit: Payer: Self-pay | Admitting: Family

## 2021-10-22 DIAGNOSIS — E538 Deficiency of other specified B group vitamins: Secondary | ICD-10-CM

## 2021-10-22 DIAGNOSIS — R635 Abnormal weight gain: Secondary | ICD-10-CM | POA: Diagnosis not present

## 2021-10-22 DIAGNOSIS — M791 Myalgia, unspecified site: Secondary | ICD-10-CM | POA: Diagnosis not present

## 2021-10-22 DIAGNOSIS — R140 Abdominal distension (gaseous): Secondary | ICD-10-CM | POA: Diagnosis not present

## 2021-10-22 DIAGNOSIS — E559 Vitamin D deficiency, unspecified: Secondary | ICD-10-CM

## 2021-10-22 DIAGNOSIS — R7989 Other specified abnormal findings of blood chemistry: Secondary | ICD-10-CM

## 2021-10-22 LAB — CBC WITH DIFFERENTIAL/PLATELET
Basophils Absolute: 0 10*3/uL (ref 0.0–0.1)
Basophils Relative: 0.8 % (ref 0.0–3.0)
Eosinophils Absolute: 0.2 10*3/uL (ref 0.0–0.7)
Eosinophils Relative: 5.6 % — ABNORMAL HIGH (ref 0.0–5.0)
HCT: 34.1 % — ABNORMAL LOW (ref 36.0–46.0)
Hemoglobin: 11.9 g/dL — ABNORMAL LOW (ref 12.0–15.0)
Lymphocytes Relative: 38.6 % (ref 12.0–46.0)
Lymphs Abs: 1.5 10*3/uL (ref 0.7–4.0)
MCHC: 34.8 g/dL (ref 30.0–36.0)
MCV: 88.7 fl (ref 78.0–100.0)
Monocytes Absolute: 0.3 10*3/uL (ref 0.1–1.0)
Monocytes Relative: 7.7 % (ref 3.0–12.0)
Neutro Abs: 1.8 10*3/uL (ref 1.4–7.7)
Neutrophils Relative %: 47.3 % (ref 43.0–77.0)
Platelets: 234 10*3/uL (ref 150.0–400.0)
RBC: 3.84 Mil/uL — ABNORMAL LOW (ref 3.87–5.11)
RDW: 12.5 % (ref 11.5–15.5)
WBC: 3.8 10*3/uL — ABNORMAL LOW (ref 4.0–10.5)

## 2021-10-22 LAB — COMPREHENSIVE METABOLIC PANEL
ALT: 13 U/L (ref 0–35)
AST: 14 U/L (ref 0–37)
Albumin: 4.1 g/dL (ref 3.5–5.2)
Alkaline Phosphatase: 60 U/L (ref 39–117)
BUN: 19 mg/dL (ref 6–23)
CO2: 25 mEq/L (ref 19–32)
Calcium: 8.7 mg/dL (ref 8.4–10.5)
Chloride: 106 mEq/L (ref 96–112)
Creatinine, Ser: 0.72 mg/dL (ref 0.40–1.20)
GFR: 108.17 mL/min (ref >60.00)
Glucose, Bld: 79 mg/dL (ref 70–99)
Potassium: 4.1 mEq/L (ref 3.5–5.1)
Sodium: 139 mEq/L (ref 135–145)
Total Bilirubin: 0.3 mg/dL (ref 0.2–1.2)
Total Protein: 6.6 g/dL (ref 6.0–8.3)

## 2021-10-22 LAB — SEDIMENTATION RATE: Sed Rate: 9 mm/hr (ref 0–20)

## 2021-10-22 LAB — VITAMIN B12: Vitamin B-12: 361 pg/mL (ref 211–911)

## 2021-10-22 LAB — HEMOGLOBIN A1C: Hgb A1c MFr Bld: 5.3 % (ref 4.6–6.5)

## 2021-10-22 LAB — VITAMIN D 25 HYDROXY (VIT D DEFICIENCY, FRACTURES): VITD: 13.09 ng/mL — ABNORMAL LOW (ref 30.00–100.00)

## 2021-10-22 LAB — TSH: TSH: 0.98 u[IU]/mL (ref 0.35–5.50)

## 2021-10-22 MED ORDER — VITAMIN D (ERGOCALCIFEROL) 1.25 MG (50000 UNIT) PO CAPS
50000.0000 [IU] | ORAL_CAPSULE | ORAL | 0 refills | Status: AC
  Filled 2021-10-22: qty 12, 84d supply, fill #0

## 2021-10-23 ENCOUNTER — Telehealth: Payer: 59 | Admitting: Nurse Practitioner

## 2021-10-23 DIAGNOSIS — J028 Acute pharyngitis due to other specified organisms: Secondary | ICD-10-CM

## 2021-10-23 DIAGNOSIS — B9689 Other specified bacterial agents as the cause of diseases classified elsewhere: Secondary | ICD-10-CM | POA: Diagnosis not present

## 2021-10-23 MED ORDER — AMOXICILLIN 500 MG PO CAPS: 500.0000 mg | ORAL_CAPSULE | Freq: Two times a day (BID) | ORAL | 0 refills | Status: AC

## 2021-10-23 NOTE — Progress Notes (Signed)
I have spent 5 minutes in review of e-visit questionnaire, review and updating patient chart, medical decision making and response to patient.  ° °Kamrynn Melott W Burman Bruington, NP ° °  °

## 2021-10-23 NOTE — Progress Notes (Signed)

## 2021-10-25 LAB — CELIAC DISEASE PANEL
(tTG) Ab, IgA: <1 U/mL
(tTG) Ab, IgG: <1 U/mL
Gliadin IgA: <1 U/mL
Gliadin IgG: <1 U/mL
Immunoglobulin A: 145 mg/dL (ref 47–310)

## 2021-10-25 LAB — RHEUMATOID FACTOR: Rheumatoid fact SerPl-aCnc: <14 IU/mL (ref <14)

## 2021-10-25 LAB — ANA: Anti Nuclear Antibody (ANA): NEGATIVE

## 2021-10-26 ENCOUNTER — Other Ambulatory Visit: Payer: Self-pay | Admitting: Family

## 2021-10-26 ENCOUNTER — Encounter: Payer: Self-pay | Admitting: Family

## 2021-10-26 ENCOUNTER — Other Ambulatory Visit (HOSPITAL_BASED_OUTPATIENT_CLINIC_OR_DEPARTMENT_OTHER): Payer: Self-pay

## 2021-10-26 MED ORDER — DULOXETINE HCL 30 MG PO CPEP
30.0000 mg | ORAL_CAPSULE | Freq: Every day | ORAL | 0 refills | Status: DC
  Filled 2021-10-26 – 2021-11-08 (×2): qty 30, 30d supply, fill #0

## 2021-11-04 ENCOUNTER — Other Ambulatory Visit (HOSPITAL_BASED_OUTPATIENT_CLINIC_OR_DEPARTMENT_OTHER): Payer: Self-pay

## 2021-11-08 ENCOUNTER — Other Ambulatory Visit (HOSPITAL_BASED_OUTPATIENT_CLINIC_OR_DEPARTMENT_OTHER): Payer: Self-pay

## 2021-12-05 ENCOUNTER — Other Ambulatory Visit: Payer: Self-pay | Admitting: Family

## 2021-12-06 ENCOUNTER — Encounter: Payer: Self-pay | Admitting: Family

## 2021-12-09 ENCOUNTER — Other Ambulatory Visit (HOSPITAL_BASED_OUTPATIENT_CLINIC_OR_DEPARTMENT_OTHER): Payer: Self-pay

## 2021-12-09 MED ORDER — DULOXETINE HCL 30 MG PO CPEP
30.0000 mg | ORAL_CAPSULE | Freq: Every day | ORAL | 0 refills | Status: DC
  Filled 2021-12-09: qty 30, 30d supply, fill #0

## 2022-01-10 ENCOUNTER — Other Ambulatory Visit: Payer: Self-pay | Admitting: Family

## 2022-01-11 ENCOUNTER — Other Ambulatory Visit (HOSPITAL_BASED_OUTPATIENT_CLINIC_OR_DEPARTMENT_OTHER): Payer: Self-pay

## 2022-01-11 MED ORDER — DULOXETINE HCL 30 MG PO CPEP
30.0000 mg | ORAL_CAPSULE | Freq: Every day | ORAL | 1 refills | Status: DC
  Filled 2022-01-11: qty 90, 90d supply, fill #0
  Filled 2022-04-16: qty 90, 90d supply, fill #1

## 2022-01-16 DIAGNOSIS — S60052A Contusion of left little finger without damage to nail, initial encounter: Secondary | ICD-10-CM | POA: Diagnosis not present

## 2022-02-14 ENCOUNTER — Ambulatory Visit: Payer: 59 | Admitting: Gastroenterology

## 2022-02-17 ENCOUNTER — Ambulatory Visit: Payer: 59 | Admitting: Family

## 2022-02-17 ENCOUNTER — Other Ambulatory Visit (HOSPITAL_BASED_OUTPATIENT_CLINIC_OR_DEPARTMENT_OTHER): Payer: Self-pay

## 2022-02-17 ENCOUNTER — Encounter: Payer: Self-pay | Admitting: Family

## 2022-02-17 ENCOUNTER — Telehealth: Payer: Self-pay

## 2022-02-17 VITALS — BP 108/64 | HR 96 | Temp 98.1°F | Ht 65.0 in | Wt 172.8 lb

## 2022-02-17 DIAGNOSIS — E559 Vitamin D deficiency, unspecified: Secondary | ICD-10-CM

## 2022-02-17 DIAGNOSIS — K649 Unspecified hemorrhoids: Secondary | ICD-10-CM | POA: Diagnosis not present

## 2022-02-17 DIAGNOSIS — R635 Abnormal weight gain: Secondary | ICD-10-CM

## 2022-02-17 DIAGNOSIS — F419 Anxiety disorder, unspecified: Secondary | ICD-10-CM | POA: Diagnosis not present

## 2022-02-17 LAB — VITAMIN D 25 HYDROXY (VIT D DEFICIENCY, FRACTURES): VITD: 21.81 ng/mL — ABNORMAL LOW (ref 30.00–100.00)

## 2022-02-17 MED ORDER — WEGOVY 0.25 MG/0.5ML ~~LOC~~ SOAJ
0.2500 mg | SUBCUTANEOUS | 0 refills | Status: DC
  Filled 2022-02-17: qty 2, 28d supply, fill #0

## 2022-02-17 MED ORDER — PROCTOFOAM HC 1-1 % EX FOAM
1.0000 | Freq: Three times a day (TID) | CUTANEOUS | 1 refills | Status: DC
  Filled 2022-02-17: qty 10, 13d supply, fill #0

## 2022-02-17 NOTE — Progress Notes (Signed)
Misty Austin is a 36 y.o. female with the following history as recorded in EpicCare:  Patient Active Problem List   Diagnosis Date Noted   Migraine without aura and with status migrainosus, not intractable 02/20/2018   Indication for care in labor and delivery, antepartum 05/12/2015   NSVD (normal spontaneous vaginal delivery) 01/29/2013   ABDOMINAL PAIN OTHER SPECIFIED SITE 07/31/2009    Current Outpatient Medications  Medication Sig Dispense Refill   cholecalciferol (VITAMIN D3) 25 MCG (1000 UNIT) tablet Take 1,000 Units by mouth daily.     DULoxetine (CYMBALTA) 30 MG capsule Take 1 capsule (30 mg total) by mouth daily. 90 capsule 1   Semaglutide-Weight Management (WEGOVY) 0.25 MG/0.5ML SOAJ Inject 0.25 mg into the skin once a week. 2 mL 0   hydrocortisone-pramoxine (PROCTOFOAM HC) rectal foam Place 1 applicator rectally 3 (three) times daily. 10 g 1   No current facility-administered medications for this visit.    Allergies: Patient has no known allergies.  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   Hx of varicella    IBS (irritable bowel syndrome)    Thyroid nodule    benign folliculr on bx 0/1751   Vaginal Pap smear, abnormal     Past Surgical History:  Procedure Laterality Date   LAPAROSCOPIC CHOLECYSTECTOMY  09/2009   thompson   WISDOM TOOTH EXTRACTION      Family History  Problem Relation Age of Onset   Esophageal cancer Maternal Grandfather    Diabetes Maternal Grandmother    Breast cancer Maternal Grandmother    Lymphoma Paternal Grandfather    Leukemia Paternal Grandfather    Diabetes Paternal Grandmother    Kidney disease Mother        familial nephritis   Melanoma Mother 17   Basal cell carcinoma Mother    Colon cancer Neg Hx    Stomach cancer Neg Hx    Rectal cancer Neg  Hx     Social History   Tobacco Use   Smoking status: Never   Smokeless tobacco: Never  Substance Use Topics   Alcohol use: No    Subjective:  Follow up to discuss chronic care needs-  1) Has been feeling much better on Cymbalta; comfortable with current dosage; 2) Frustrated with inability to lose weight- exercising 3-4 x per week; feels like she is eating very well; wonders about hormonal testing/ treatment options; 3) needs refill of cream for hemorrhoids;    Objective:  Vitals:   02/17/22 0828  BP: 108/64  Pulse: 96  Temp: 98.1 F (36.7 C)  TempSrc: Oral  SpO2: 98%  Weight: 172 lb 12.8 oz (78.4 kg)  Height: '5\' 5"'$  (1.651 m)    General: Well developed, well nourished, in no acute distress  Skin : Warm and dry.  Head: Normocephalic and atraumatic  Lungs: Respirations unlabored;  Neurologic: Alert and oriented; speech intact; face symmetrical; moves all extremities well; CNII-XII intact without focal deficit   Assessment:  1. Vitamin D deficiency   2. Weight gain   3. Hemorrhoids, unspecified hemorrhoid type   4. Anxiety     Plan:  Update lab today; Patient will plan to meet with nutritionist- she thinks she has contact at hospital and personal trainer through employer; will give short term trial of Wegovy to help jump  start weight loss goals; Refill updated as requested; Good response to Cymbalta- continue same medication.    No follow-ups on file.  Orders Placed This Encounter  Procedures   Vitamin D (25 hydroxy)    Requested Prescriptions   Signed Prescriptions Disp Refills   Semaglutide-Weight Management (WEGOVY) 0.25 MG/0.5ML SOAJ 2 mL 0    Sig: Inject 0.25 mg into the skin once a week.   hydrocortisone-pramoxine (PROCTOFOAM HC) rectal foam 10 g 1    Sig: Place 1 applicator rectally 3 (three) times daily.

## 2022-02-17 NOTE — Telephone Encounter (Signed)
PA initiated via Covermymeds; KEY: [id number]. Awaiting determination.

## 2022-02-18 ENCOUNTER — Other Ambulatory Visit: Payer: Self-pay | Admitting: Family

## 2022-02-18 ENCOUNTER — Other Ambulatory Visit (HOSPITAL_BASED_OUTPATIENT_CLINIC_OR_DEPARTMENT_OTHER): Payer: Self-pay

## 2022-02-18 MED ORDER — VITAMIN D (ERGOCALCIFEROL) 1.25 MG (50000 UNIT) PO CAPS
50000.0000 [IU] | ORAL_CAPSULE | ORAL | 0 refills | Status: AC
  Filled 2022-02-18: qty 12, 84d supply, fill #0

## 2022-02-21 NOTE — Telephone Encounter (Signed)
PA approved.   The request has been approved. The authorization is effective from 02/19/2022 to 09/10/2022, as long as the member is enrolled in their current health plan. The request was approved as submitted. This request has been approved for 71m per 28 days.The following prior authorizations have also been entered effective 02/19/2022 through 09/10/2022: Wegovy 0.'5mg'$ /0.561m allowing 56m38mer 28 days; please reference authorization number [id number] '1mg'$ /0.5mL13mllowing 56mL 30m 28 days; please reference authorization number [id number] 1.'7mg'$ /0.75mL,50mowing 3mL pe1056m8 days; please reference authorization number [id number]. [id number] 2.'4mg'$ /0.75mL, a26ming 3mL per 4mdays; please reference authorization number [id number]. A [id number] notification letter will follow with additional details.

## 2022-02-21 NOTE — Telephone Encounter (Signed)
FYI to provider

## 2022-02-22 ENCOUNTER — Encounter (HOSPITAL_BASED_OUTPATIENT_CLINIC_OR_DEPARTMENT_OTHER): Payer: Self-pay

## 2022-02-22 ENCOUNTER — Other Ambulatory Visit (HOSPITAL_BASED_OUTPATIENT_CLINIC_OR_DEPARTMENT_OTHER): Payer: Self-pay

## 2022-02-23 ENCOUNTER — Other Ambulatory Visit (HOSPITAL_BASED_OUTPATIENT_CLINIC_OR_DEPARTMENT_OTHER): Payer: Self-pay

## 2022-03-14 ENCOUNTER — Other Ambulatory Visit: Payer: Self-pay | Admitting: Family

## 2022-03-14 ENCOUNTER — Other Ambulatory Visit (HOSPITAL_BASED_OUTPATIENT_CLINIC_OR_DEPARTMENT_OTHER): Payer: Self-pay

## 2022-03-14 ENCOUNTER — Encounter: Payer: Self-pay | Admitting: Family

## 2022-03-14 MED ORDER — ONDANSETRON 8 MG PO TBDP
8.0000 mg | ORAL_TABLET | Freq: Three times a day (TID) | ORAL | 0 refills | Status: DC | PRN
  Filled 2022-03-14: qty 20, 7d supply, fill #0

## 2022-03-15 ENCOUNTER — Other Ambulatory Visit: Payer: Self-pay | Admitting: Family

## 2022-03-15 ENCOUNTER — Ambulatory Visit
Admission: RE | Admit: 2022-03-15 | Discharge: 2022-03-15 | Disposition: A | Discharge status: Home / Self Care | Payer: 59 | Source: Ambulatory Visit | Attending: Family | Admitting: Family

## 2022-03-15 ENCOUNTER — Other Ambulatory Visit (HOSPITAL_BASED_OUTPATIENT_CLINIC_OR_DEPARTMENT_OTHER): Payer: Self-pay

## 2022-03-15 ENCOUNTER — Ambulatory Visit: Payer: 59 | Admitting: Family

## 2022-03-15 VITALS — BP 102/60 | HR 78 | Temp 97.8°F | Resp 18 | Ht 65.0 in | Wt 163.2 lb

## 2022-03-15 DIAGNOSIS — R1031 Right lower quadrant pain: Secondary | ICD-10-CM

## 2022-03-15 DIAGNOSIS — R109 Unspecified abdominal pain: Secondary | ICD-10-CM

## 2022-03-15 DIAGNOSIS — K429 Umbilical hernia without obstruction or gangrene: Secondary | ICD-10-CM | POA: Diagnosis not present

## 2022-03-15 LAB — CBC WITH DIFFERENTIAL/PLATELET
Basophils Absolute: 0 10*3/uL (ref 0.0–0.1)
Basophils Relative: 0.2 % (ref 0.0–3.0)
Eosinophils Absolute: 0.5 10*3/uL (ref 0.0–0.7)
Eosinophils Relative: 9.3 % — ABNORMAL HIGH (ref 0.0–5.0)
HCT: 34.2 % — ABNORMAL LOW (ref 36.0–46.0)
Hemoglobin: 12 g/dL (ref 12.0–15.0)
Lymphocytes Relative: 25.9 % (ref 12.0–46.0)
Lymphs Abs: 1.3 10*3/uL (ref 0.7–4.0)
MCHC: 35 g/dL (ref 30.0–36.0)
MCV: 87.6 fl (ref 78.0–100.0)
Monocytes Absolute: 0.5 10*3/uL (ref 0.1–1.0)
Monocytes Relative: 10.4 % (ref 3.0–12.0)
Neutro Abs: 2.8 10*3/uL (ref 1.4–7.7)
Neutrophils Relative %: 54.2 % (ref 43.0–77.0)
Platelets: 242 10*3/uL (ref 150.0–400.0)
RBC: 3.91 Mil/uL (ref 3.87–5.11)
RDW: 12.3 % (ref 11.5–15.5)
WBC: 5.1 10*3/uL (ref 4.0–10.5)

## 2022-03-15 LAB — COMPREHENSIVE METABOLIC PANEL
ALT: 12 U/L (ref 0–35)
AST: 14 U/L (ref 0–37)
Albumin: 4 g/dL (ref 3.5–5.2)
Alkaline Phosphatase: 75 U/L (ref 39–117)
BUN: 16 mg/dL (ref 6–23)
CO2: 30 mEq/L (ref 19–32)
Calcium: 9 mg/dL (ref 8.4–10.5)
Chloride: 102 mEq/L (ref 96–112)
Creatinine, Ser: 0.77 mg/dL (ref 0.40–1.20)
GFR: 99.52 mL/min (ref >60.00)
Glucose, Bld: 90 mg/dL (ref 70–99)
Potassium: 4 mEq/L (ref 3.5–5.1)
Sodium: 137 mEq/L (ref 135–145)
Total Bilirubin: 0.2 mg/dL (ref 0.2–1.2)
Total Protein: 6.7 g/dL (ref 6.0–8.3)

## 2022-03-15 LAB — AMYLASE: Amylase: 31 U/L (ref 27–131)

## 2022-03-15 LAB — LIPASE: Lipase: 18 U/L (ref 11.0–59.0)

## 2022-03-15 MED ORDER — DICYCLOMINE HCL 10 MG PO CAPS
10.0000 mg | ORAL_CAPSULE | Freq: Three times a day (TID) | ORAL | 0 refills | Status: DC
  Filled 2022-03-15: qty 30, 10d supply, fill #0

## 2022-03-15 MED ORDER — IOPAMIDOL (ISOVUE-300) INJECTION 61%
80.0000 mL | Freq: Once | INTRAVENOUS | Status: AC | PRN
  Administered 2022-03-15: 80 mL via INTRAVENOUS

## 2022-03-15 NOTE — Patient Instructions (Signed)
They are going to do you as a walk-in today; you need to be there before 3 pm;  [address] [place] [facility], [place] [address]

## 2022-03-15 NOTE — Progress Notes (Signed)
Misty Austin is a 36 y.o. female with the following history as recorded in EpicCare:  Patient Active Problem List   Diagnosis Date Noted   Migraine without aura and with status migrainosus, not intractable 02/20/2018   Indication for care in labor and delivery, antepartum 05/12/2015   NSVD (normal spontaneous vaginal delivery) 01/29/2013   ABDOMINAL PAIN OTHER SPECIFIED SITE 07/31/2009    Current Outpatient Medications  Medication Sig Dispense Refill   cholecalciferol (VITAMIN D3) 25 MCG (1000 UNIT) tablet Take 1,000 Units by mouth daily.     DULoxetine (CYMBALTA) 30 MG capsule Take 1 capsule (30 mg total) by mouth daily. 90 capsule 1   hydrocortisone-pramoxine (PROCTOFOAM HC) rectal foam Place 1 applicator rectally 3 (three) times daily. 10 g 1   ondansetron (ZOFRAN-ODT) 8 MG disintegrating tablet Dissolve 1 tablet (8 mg total) by mouth every 8 (eight) hours as needed for nausea or vomiting. 20 tablet 0   Vitamin D, Ergocalciferol, (DRISDOL) 1.25 MG (50000 UNIT) CAPS capsule Take 1 capsule (50,000 Units total) by mouth every 7 (seven) days for 12 doses. 12 capsule 0   No current facility-administered medications for this visit.    Allergies: Patient has no known allergies.  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   Hx of varicella    IBS (irritable bowel syndrome)    Thyroid nodule    benign folliculr on bx 07/7354   Vaginal Pap smear, abnormal     Past Surgical History:  Procedure Laterality Date   LAPAROSCOPIC CHOLECYSTECTOMY  09/2009   thompson   WISDOM TOOTH EXTRACTION      Family History  Problem Relation Age of Onset   Esophageal cancer Maternal Grandfather    Diabetes Maternal Grandmother    Breast cancer Maternal Grandmother    Lymphoma Paternal Grandfather    Leukemia Paternal  Grandfather    Diabetes Paternal Grandmother    Kidney disease Mother        familial nephritis   Melanoma Mother 7   Basal cell carcinoma Mother    Colon cancer Neg Hx    Stomach cancer Neg Hx    Rectal cancer Neg Hx     Social History   Tobacco Use   Smoking status: Never   Smokeless tobacco: Never  Substance Use Topics   Alcohol use: No    Subjective:   Has recently started Wegovy- did well with 1st 2 weeks; however with 3rd injection last Wednesday, she became concerned for GI side effects that were not tolerable; has continued to have nausea and abdominal pain; feels like lower abdominal cramping- localized on right side; more noticeable with movement; feels like constipation worsening over the past few days; no fever- able to keep dry toast down today;     Objective:  Vitals:   03/15/22 1102  BP: 102/60  Pulse: 78  Resp: 18  Temp: 97.8 F (36.6 C)  TempSrc: Temporal  SpO2: 94%  Weight: 163 lb 3.2 oz (74 kg)  Height: _0  (1.651 m)    General: Well developed, well nourished, in no acute distress  Skin : Warm and dry.  Head: Normocephalic and atraumatic  Eyes: Sclera and conjunctiva clear; pupils round and reactive to light; extraocular movements intact  Ears: External normal; canals clear; tympanic membranes normal  Oropharynx:  Pink, supple. No suspicious lesions  Neck: Supple without thyromegaly, adenopathy  Lungs: Respirations unlabored; clear to auscultation bilaterally without wheeze, rales, rhonchi  CVS exam: normal rate and regular rhythm.  Abdomen: Soft; mild tender RLQ; nondistended; normoactive bowel sounds; no masses or hepatosplenomegaly  Neurologic: Alert and oriented; speech intact; face symmetrical; moves all extremities well; CNII-XII intact without focal deficit   Assessment:  1. Abdominal pain, unspecified abdominal location   2. Right lower quadrant abdominal pain     Plan:  Physical appearance is reassuring in office; however am  concerned that symptoms are more than would be expected from [facility] especially since she is almost a full week out from last injection; check labs and STAT CT today; follow up to be determined.   No follow-ups on file.  Orders Placed This Encounter  Procedures   CT Abdomen Pelvis W Contrast    Umr(cone) - no auth req'd//[email or web address] Spoke [email or web address]  301 site    Standing Status:   Future    Standing Expiration Date:   03/16/2023    Order Specific Question:   If indicated for the ordered procedure, I authorize the administration of contrast media per Radiology protocol    Answer:   Yes    Order Specific Question:   Is patient pregnant?    Answer:   No    Comments:   LMP today    Order Specific Question:   Preferred imaging location?    Answer:   [facility]    Order Specific Question:   Is Oral Contrast requested for this exam?    Answer:   Yes, Per Radiology protocol   CBC with Differential/Platelet   Amylase   Lipase   Comp Met (CMET)    Requested Prescriptions    No prescriptions requested or ordered in this encounter

## 2022-03-19 ENCOUNTER — Telehealth: Payer: 59 | Admitting: Physician Assistant

## 2022-03-19 DIAGNOSIS — B9789 Other viral agents as the cause of diseases classified elsewhere: Secondary | ICD-10-CM | POA: Diagnosis not present

## 2022-03-19 DIAGNOSIS — J019 Acute sinusitis, unspecified: Secondary | ICD-10-CM | POA: Diagnosis not present

## 2022-03-19 MED ORDER — IPRATROPIUM BROMIDE 0.03 % NA SOLN: 2.0000 | Freq: Two times a day (BID) | NASAL | 0 refills | Status: DC

## 2022-03-19 MED ORDER — FLUTICASONE PROPIONATE 50 MCG/ACT NA SUSP: 2.0000 | Freq: Every day | NASAL | 0 refills | Status: DC

## 2022-03-19 NOTE — Progress Notes (Signed)
E-Visit for Sinus Problems  We are sorry that you are not feeling well.  Here is how we plan to help!  Based on what you have shared with me it looks like you have sinusitis.  Sinusitis is inflammation and infection in the sinus cavities of the head.  Based on your presentation I believe you most likely have Acute Viral Sinusitis.This is an infection most likely caused by a virus. There is not specific treatment for viral sinusitis other than to help you with the symptoms until the infection runs its course.  You may use an oral decongestant such as Mucinex D or if you have glaucoma or high blood pressure use plain Mucinex. Saline nasal spray help and can safely be used as often as needed for congestion, I have prescribed: Fluticasone nasal spray two sprays in each nostril once a day and Ipratropium Bromide nasal spray 0.03% 2 sprays in eah nostril 2-3 times a day  Some authorities believe that zinc sprays or the use of Echinacea may shorten the course of your symptoms.  Sinus infections are not as easily transmitted as other respiratory infection, however we still recommend that you avoid close contact with loved ones, especially the very young and elderly.  Remember to wash your hands thoroughly throughout the day as this is the number one way to prevent the spread of infection!  Home Care: Only take medications as instructed by your medical team. Do not take these medications with alcohol. A steam or ultrasonic humidifier can help congestion.  You can place a towel over your head and breathe in the steam from hot water coming from a faucet. Avoid close contacts especially the very young and the elderly. Cover your mouth when you cough or sneeze. Always remember to wash your hands.  Get Help Right Away If: You develop worsening fever or sinus pain. You develop a severe head ache or visual changes. Your symptoms persist after you have completed your treatment plan.  Make sure you Understand  these instructions. Will watch your condition. Will get help right away if you are not doing well or get worse.   Thank you for choosing an e-visit.  Your e-visit answers were reviewed by a board certified advanced clinical practitioner to complete your personal care plan. Depending upon the condition, your plan could have included both over the counter or prescription medications.  Please review your pharmacy choice. Make sure the pharmacy is open so you can pick up prescription now. If there is a problem, you may contact your provider through [employer] and have the prescription routed to another pharmacy.  Your safety is important to [place]. If you have drug allergies check your prescription carefully.   For the next 24 hours you can use MyChart to ask questions about today's visit, request a non-urgent call back, or ask for a work or school excuse. You will get an email in the next two days asking about your experience. I hope that your e-visit has been valuable and will speed your recovery.  I provided 5 minutes of non face-to-face time during this encounter for chart review and documentation.

## 2022-03-23 ENCOUNTER — Ambulatory Visit: Admit: 2022-03-23 | Discharge status: Absent without leave | Payer: 59

## 2022-03-23 ENCOUNTER — Ambulatory Visit
Admission: EM | Admit: 2022-03-23 | Discharge: 2022-03-23 | Disposition: A | Discharge status: Home / Self Care | Payer: 59 | Attending: Family Medicine | Admitting: Family Medicine

## 2022-03-23 DIAGNOSIS — J0100 Acute maxillary sinusitis, unspecified: Secondary | ICD-10-CM

## 2022-03-23 MED ORDER — AMOXICILLIN-POT CLAVULANATE 875-125 MG PO TABS: 1.0000 | ORAL_TABLET | Freq: Two times a day (BID) | ORAL | 0 refills | Status: DC

## 2022-03-23 NOTE — ED Provider Notes (Signed)
[facility]    CSN: [id number] Arrival date & time: 03/23/22  1546      History   Chief Complaint Chief Complaint  Patient presents with   Nasal Congestion   Facial Pain    HPI Misty Austin is a 36 y.o. female.   HPI  Patient has sinus symptoms for over a week.  She called for a video visit.  Was told she had a viral infection.  Treated with Atrovent and Flonase.  In spite of this her pain is worse.  The pain is intensifying.  It is in her right cheek and behind her right eye.  Some pain in her right ear as well.  She is having some postnasal drip.  Mild sore throat.  Headache.  No fever chills or body ache.  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   Hx of varicella    IBS (irritable bowel syndrome)    Thyroid nodule    benign folliculr on bx 02/2422   Vaginal Pap smear, abnormal     Patient Active Problem List   Diagnosis Date Noted   Migraine without aura and with status migrainosus, not intractable 02/20/2018   Indication for care in labor and delivery, antepartum 05/12/2015   NSVD (normal spontaneous vaginal delivery) 01/29/2013   ABDOMINAL PAIN OTHER SPECIFIED SITE 07/31/2009    Past Surgical History:  Procedure Laterality Date   LAPAROSCOPIC CHOLECYSTECTOMY  09/2009   [facility]   WISDOM TOOTH EXTRACTION      OB History     Gravida  2   Para  2   Term  2   Preterm      AB      Living  2      SAB      IAB      Ectopic      Multiple  0   Live Births  2            Home Medications    Prior to Admission medications   Medication Sig Start Date End Date Taking? Authorizing Provider  amoxicillin-clavulanate (AUGMENTIN) 875-125 MG tablet Take 1 tablet by mouth every 12 (twelve) hours. 03/23/22  Yes Raylene Everts, MD   cholecalciferol (VITAMIN D3) 25 MCG (1000 UNIT) tablet Take 1,000 Units by mouth daily.    [provider]  dicyclomine (BENTYL) 10 MG capsule Take 1 capsule (10 mg total) by mouth 3 (three) times daily before meals. 03/15/22   Marrian Salvage, FNP  DULoxetine (CYMBALTA) 30 MG capsule Take 1 capsule (30 mg total) by mouth daily. 01/11/22   Marrian Salvage, FNP  fluticasone (FLONASE) 50 MCG/ACT nasal spray Place 2 sprays into both nostrils daily. 03/19/22   Mar Daring, PA-C  hydrocortisone-pramoxine (PROCTOFOAM HC) rectal foam Place 1 applicator rectally 3 (three) times daily. 02/17/22   Marrian Salvage, FNP  ipratropium (ATROVENT) 0.03 % nasal spray Place 2 sprays into both nostrils every 12 (twelve) hours. 03/19/22   Mar Daring, PA-C  ondansetron (ZOFRAN-ODT) 8 MG disintegrating tablet Dissolve 1 tablet (8 mg total) by mouth every 8 (eight) hours as needed for nausea or vomiting. 03/14/22   Marrian Salvage, FNP  Vitamin D, Ergocalciferol, (DRISDOL) 1.25 MG (50000 UNIT) CAPS capsule Take 1  capsule (50,000 Units total) by mouth every 7 (seven) days for 12 doses. 02/18/22 05/18/22  Marrian Salvage, FNP    Family History Family History  Problem Relation Age of Onset   Esophageal cancer Maternal Grandfather    Diabetes Maternal Grandmother    Breast cancer Maternal Grandmother    Lymphoma Paternal Grandfather    Leukemia Paternal Grandfather    Diabetes Paternal Grandmother    Kidney disease Mother        familial nephritis   Melanoma Mother 40   Basal cell carcinoma Mother    Colon cancer Neg Hx    Stomach cancer Neg Hx    Rectal cancer Neg Hx     Social History Social History   Tobacco Use   Smoking status: Never   Smokeless tobacco: Never  Vaping Use   Vaping Use: Never used  Substance Use Topics   Alcohol use: No   Drug use: No     Allergies   Patient has no known allergies.   Review of Systems Review of  Systems  See HPI Physical Exam Triage Vital Signs ED Triage Vitals [03/23/22 1556]  Enc Vitals Group     BP 117/78     Pulse Rate 81     Resp 18     Temp 98.2 F (36.8 C)     Temp Source Oral     SpO2 98 %     Weight      Height      Head Circumference      Peak Flow      Pain Score 7     Pain Loc      Pain Edu?      Excl. in [place]?    No data found.  Updated Vital Signs BP 117/78 (BP Location: Right Arm)   Pulse 81   Temp 98.2 F (36.8 C) (Oral)   Resp 18   SpO2 98%   Physical Exam Constitutional:      General: She is not in acute distress.    Appearance: She is well-developed.  HENT:     Head: Normocephalic and atraumatic.     Right Ear: Tympanic membrane and ear canal normal.     Left Ear: Tympanic membrane and ear canal normal.     Nose: Congestion present.     Mouth/Throat:     Pharynx: Posterior oropharyngeal erythema present.     Comments: Nasal membranes swollen and red.  Facial sinuses maxillary and ethmoid are tender on the right side only.  Posterior pharynx has moderate injection.  No adenopathy. Eyes:     Conjunctiva/sclera: Conjunctivae normal.     Pupils: Pupils are equal, round, and reactive to light.  Cardiovascular:     Rate and Rhythm: Normal rate and regular rhythm.  Pulmonary:     Effort: Pulmonary effort is normal. No respiratory distress.     Breath sounds: Normal breath sounds.  Abdominal:     General: There is no distension.     Palpations: Abdomen is soft.  Musculoskeletal:        General: Normal range of motion.     Cervical back: Normal range of motion.  Lymphadenopathy:     Cervical: No cervical adenopathy.  Skin:    General: Skin is warm and dry.  Neurological:     General: No focal deficit present.     Mental Status: She is alert.  Psychiatric:        Mood and Affect: Mood normal.  Behavior: Behavior normal.      UC Treatments / Results  Labs (all labs ordered are listed, but only abnormal results are  displayed) Labs Reviewed - No data to display  EKG   Radiology No results found.  Procedures Procedures (including critical care time)  Medications Ordered in UC Medications - No data to display  Initial Impression / Assessment and Plan / UC Course  I have reviewed the triage vital signs and the nursing notes.  Pertinent labs & imaging results that were available during my care of the patient were reviewed by me and considered in my medical decision making (see chart for details).     Patient has been treated appropriately for progress but has worsening symptoms.  Discussed methods to promote drainage.  Antibiotics.  Reasons for return Final Clinical Impressions(s) / UC Diagnoses   Final diagnoses:  Acute non-recurrent maxillary sinusitis     Discharge Instructions      Continue flonase and nasal saline Drink lots of water Consider adding mucinex Take the augmentin 2 x a day Take a probiotic if needed Call for problems.   ED Prescriptions     Medication Sig Dispense Auth. Provider   amoxicillin-clavulanate (AUGMENTIN) 875-125 MG tablet Take 1 tablet by mouth every 12 (twelve) hours. 14 tablet Raylene Everts, MD      PDMP not reviewed this encounter.   Raylene Everts, MD 03/23/22 [phone]

## 2022-03-23 NOTE — Discharge Instructions (Signed)
Continue flonase and nasal saline Drink lots of water Consider adding mucinex Take the augmentin 2 x a day Take a probiotic if needed Call for problems.

## 2022-03-23 NOTE — ED Triage Notes (Addendum)
Pt c/o nasal congestion and facial pain x 1 week. Pain on RT side of face and RT ear.Teledoc on 9/16. Rx'd flonase and inhaler. No relief since. Denies fever. Tylenol cold and flu and ibuprofen prn as well.

## 2022-03-24 ENCOUNTER — Telehealth: Payer: Self-pay | Admitting: Emergency Medicine

## 2022-03-24 NOTE — Telephone Encounter (Signed)
Patient is starting to improve

## 2022-03-30 DIAGNOSIS — Z01419 Encounter for gynecological examination (general) (routine) without abnormal findings: Secondary | ICD-10-CM | POA: Diagnosis not present

## 2022-03-30 DIAGNOSIS — Z130 Encounter for screening for diseases of the blood and blood-forming organs and certain disorders involving the immune mechanism: Secondary | ICD-10-CM | POA: Diagnosis not present

## 2022-03-30 DIAGNOSIS — Z1389 Encounter for screening for other disorder: Secondary | ICD-10-CM | POA: Diagnosis not present

## 2022-03-30 DIAGNOSIS — R635 Abnormal weight gain: Secondary | ICD-10-CM | POA: Diagnosis not present

## 2022-04-16 ENCOUNTER — Other Ambulatory Visit (HOSPITAL_COMMUNITY): Payer: Self-pay

## 2022-06-04 DIAGNOSIS — M7661 Achilles tendinitis, right leg: Secondary | ICD-10-CM | POA: Diagnosis not present

## 2022-06-20 ENCOUNTER — Ambulatory Visit: Payer: 59

## 2022-06-20 ENCOUNTER — Ambulatory Visit: Payer: 59 | Admitting: Podiatry

## 2022-06-20 DIAGNOSIS — M7661 Achilles tendinitis, right leg: Secondary | ICD-10-CM

## 2022-06-20 DIAGNOSIS — M7750 Other enthesopathy of unspecified foot: Secondary | ICD-10-CM

## 2022-06-20 NOTE — Patient Instructions (Signed)

## 2022-06-21 NOTE — Progress Notes (Signed)
  Subjective:  Patient ID: Misty Austin, female    DOB: [birth year],  MRN: [id number]  Chief Complaint  Patient presents with   Tendonitis    NP Right Achilles and heal pain/swelling > 1 month -had xrays at [facility] - pain is the back of heel and some pain on the bottom - starting to get tingling     36 y.o. female presents with the above complaint. History confirmed with patient.  She works as a Marine scientist at [employer], mostly in an office type setting  Objective:  Physical Exam: warm, good capillary refill, no trophic changes or ulcerative lesions, normal DP and PT pulses, and normal sensory exam. Left Foot: normal exam, no swelling, tenderness, instability; ligaments intact, full range of motion of all ankle/foot joints Right Foot: tenderness at Achilles tendon insertion  Assessment:   1. Achilles tendinitis of right lower extremity      Plan:  Patient was evaluated and treated and all questions answered.  Discussed the etiology and treatment options for Achilles tendinitis including stretching, formal physical therapy with an eccentric exercises therapy plan, supportive shoegears such as a running shoe or sneaker, heel lifts, topical and oral medications.  We also discussed that I do not routinely perform injections in this area because of the risk of an increased damage or rupture of the tendon.  We also discussed the role of surgical treatment of this for patients who do not improve after exhausting non-surgical treatment options.  -Educated on stretching and icing of the affected limb. -Recommended topical Voltaren gel 3 times daily -Heel lift dispensed to offload the Achilles tendon -We will consider a methylprednisolone taper if not improving she is unable to take NSAIDs. -Referral to physical therapy if she does not improve as well and consider boot immobilization  Return in about 8 weeks (around 08/15/2022) for re-check Achilles tendon.

## 2022-07-26 ENCOUNTER — Telehealth: Payer: Self-pay | Admitting: Family

## 2022-07-26 ENCOUNTER — Other Ambulatory Visit (HOSPITAL_BASED_OUTPATIENT_CLINIC_OR_DEPARTMENT_OTHER): Payer: Self-pay

## 2022-07-26 MED ORDER — DULOXETINE HCL 30 MG PO CPEP
30.0000 mg | ORAL_CAPSULE | Freq: Every day | ORAL | 1 refills | Status: DC
  Filled 2022-07-26: qty 90, 90d supply, fill #0

## 2022-07-26 MED ORDER — DULOXETINE HCL 30 MG PO CPEP
30.0000 mg | ORAL_CAPSULE | Freq: Every day | ORAL | 1 refills | Status: DC
  Filled 2022-07-26: qty 90, 90d supply, fill #0
  Filled 2022-11-10: qty 90, 90d supply, fill #1

## 2022-07-26 NOTE — Telephone Encounter (Signed)
Prescription Request  07/26/2022  Is this a "Controlled Substance" medicine? No  LOV: 03/15/2022  What is the name of the medication or equipment?  [id number]  DULoxetine (CYMBALTA) 30 MG capsule   Have you contacted your pharmacy to request a refill? Yes   Which pharmacy would you like this sent to?  [facility] [address], [place] [address] Phone: [phone] Fax: [phone]    Patient notified that their request is being sent to the clinical staff for review and that they should receive a response within 2 business days.   Please advise at [place]

## 2022-07-26 NOTE — Telephone Encounter (Signed)
Patient notified that rx has been sent in. 

## 2022-08-04 DIAGNOSIS — F330 Major depressive disorder, recurrent, mild: Secondary | ICD-10-CM | POA: Diagnosis not present

## 2022-08-04 DIAGNOSIS — F411 Generalized anxiety disorder: Secondary | ICD-10-CM | POA: Diagnosis not present

## 2022-08-10 ENCOUNTER — Other Ambulatory Visit (HOSPITAL_BASED_OUTPATIENT_CLINIC_OR_DEPARTMENT_OTHER): Payer: Self-pay

## 2022-08-10 ENCOUNTER — Encounter: Payer: Self-pay | Admitting: Family Medicine

## 2022-08-10 ENCOUNTER — Ambulatory Visit: Payer: Commercial Managed Care - PPO | Admitting: Family Medicine

## 2022-08-10 VITALS — BP 107/73 | HR 89 | Temp 97.7°F | Resp 16 | Ht 65.0 in | Wt 166.0 lb

## 2022-08-10 DIAGNOSIS — J020 Streptococcal pharyngitis: Secondary | ICD-10-CM | POA: Diagnosis not present

## 2022-08-10 DIAGNOSIS — J029 Acute pharyngitis, unspecified: Secondary | ICD-10-CM

## 2022-08-10 LAB — POCT RAPID STREP A (OFFICE): Rapid Strep A Screen: POSITIVE — AB

## 2022-08-10 MED ORDER — AMOXICILLIN 500 MG PO CAPS
500.0000 mg | ORAL_CAPSULE | Freq: Two times a day (BID) | ORAL | 0 refills | Status: AC
  Filled 2022-08-10: qty 20, 10d supply, fill #0

## 2022-08-10 NOTE — Patient Instructions (Signed)
Strep test positive. Adding amoxicillin.  Continue supportive measures including rest, hydration, humidifier use, steam showers, warm compresses to sinuses, warm liquids with lemon and honey, and over-the-counter cough, cold, and analgesics as needed.   Please contact office for follow-up if symptoms do not improve or worsen. Seek emergency care if symptoms become severe.

## 2022-08-10 NOTE — Progress Notes (Signed)
Acute Office Visit  Subjective:     Patient ID: Misty Austin, female    DOB: 08-22-1985, 37 y.o.   MRN: [id number]  Chief Complaint  Patient presents with   possible strep    Daughter tested positive for strep    HPI Patient is in today for sore throat.   Patient states she has had a sore throat for the past several days. She thought it was just from some postnasal drainage, no other URI symptoms - no coughing, sneezing, fever, chills, headaches, rhinorrhea. Her daughter just tested positive for strep and they have had several other strep exposures.     ROS All review of systems negative except what is listed in the HPI      Objective:    BP 107/73   Pulse 89   Temp 97.7 F (36.5 C)   Resp 16   Ht '5\' 5"'$  (1.651 m)   Wt 166 lb (75.3 kg)   SpO2 97%   BMI 27.62 kg/m    Physical Exam Vitals reviewed.  Constitutional:      Appearance: Normal appearance.  HENT:     Head: Normocephalic and atraumatic.     Right Ear: Tympanic membrane normal.     Left Ear: Tympanic membrane normal.     Nose: No congestion.     Mouth/Throat:     Mouth: Mucous membranes are moist.     Pharynx: Oropharynx is clear. No oropharyngeal exudate.     Comments: Mild erythema and cobblestoning/drainage noted Cardiovascular:     Rate and Rhythm: Normal rate and regular rhythm.     Pulses: Normal pulses.     Heart sounds: Normal heart sounds.  Pulmonary:     Effort: Pulmonary effort is normal.     Breath sounds: Normal breath sounds.  Musculoskeletal:     Cervical back: Normal range of motion and neck supple. No tenderness.  Lymphadenopathy:     Cervical: No cervical adenopathy.  Skin:    General: Skin is warm and dry.  Neurological:     Mental Status: She is alert and oriented to person, place, and time.  Psychiatric:        Mood and Affect: Mood normal.        Behavior: Behavior normal.        Thought Content: Thought content normal.        Judgment: Judgment normal.         Results for orders placed or performed in visit on 08/10/22  POCT rapid strep A  Result Value Ref Range   Rapid Strep A Screen Positive (A) Negative        Assessment & Plan:   Problem List Items Addressed This Visit   None Visit Diagnoses     Strep pharyngitis    -  Primary   Relevant Medications   amoxicillin (AMOXIL) 500 MG capsule   Sore throat       Relevant Orders   POCT rapid strep A (Completed)     Strep test positive. Adding amoxicillin.  Continue supportive measures including rest, hydration, humidifier use, steam showers, warm compresses to sinuses, warm liquids with lemon and honey, and over-the-counter cough, cold, and analgesics as needed.   Meds ordered this encounter  Medications   amoxicillin (AMOXIL) 500 MG capsule    Sig: Take 1 capsule (500 mg total) by mouth 2 (two) times daily for 10 days.    Dispense:  20 capsule    Refill:  0  Order Specific Question:   Supervising Provider    Answer:   Mosie Lukes [[id number]]    Return if symptoms worsen or fail to improve.  Terrilyn Saver, NP

## 2022-08-18 DIAGNOSIS — F330 Major depressive disorder, recurrent, mild: Secondary | ICD-10-CM | POA: Diagnosis not present

## 2022-08-18 DIAGNOSIS — F411 Generalized anxiety disorder: Secondary | ICD-10-CM | POA: Diagnosis not present

## 2022-08-26 DIAGNOSIS — L821 Other seborrheic keratosis: Secondary | ICD-10-CM | POA: Diagnosis not present

## 2022-09-14 ENCOUNTER — Encounter: Payer: Self-pay | Admitting: Family

## 2022-09-15 ENCOUNTER — Telehealth (INDEPENDENT_AMBULATORY_CARE_PROVIDER_SITE_OTHER): Payer: Commercial Managed Care - PPO | Admitting: Family

## 2022-09-15 ENCOUNTER — Encounter: Payer: Self-pay | Admitting: Family

## 2022-09-15 VITALS — Ht 65.0 in | Wt 160.0 lb

## 2022-09-15 DIAGNOSIS — R5383 Other fatigue: Secondary | ICD-10-CM | POA: Diagnosis not present

## 2022-09-15 DIAGNOSIS — R7989 Other specified abnormal findings of blood chemistry: Secondary | ICD-10-CM | POA: Diagnosis not present

## 2022-09-15 DIAGNOSIS — E559 Vitamin D deficiency, unspecified: Secondary | ICD-10-CM | POA: Diagnosis not present

## 2022-09-15 NOTE — Progress Notes (Signed)
Misty Austin is a 37 y.o. female with the following history as recorded in [employer]:  Patient Active Problem List   Diagnosis Date Noted   Migraine without aura and with status migrainosus, not intractable 02/20/2018   Indication for care in labor and delivery, antepartum 05/12/2015   NSVD (normal spontaneous vaginal delivery) 01/29/2013   ABDOMINAL PAIN OTHER SPECIFIED SITE 07/31/2009    Current Outpatient Medications  Medication Sig Dispense Refill   DULoxetine (CYMBALTA) 30 MG capsule Take 1 capsule (30 mg total) by mouth daily. 90 capsule 1   No current facility-administered medications for this visit.    Allergies: Patient has no known allergies.  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   Hx of varicella    IBS (irritable bowel syndrome)    Thyroid nodule    benign folliculr on bx [id number]   Vaginal Pap smear, abnormal     Past Surgical History:  Procedure Laterality Date   LAPAROSCOPIC CHOLECYSTECTOMY  09/2009   [facility]   WISDOM TOOTH EXTRACTION      Family History  Problem Relation Age of Onset   Esophageal cancer Maternal Grandfather    Diabetes Maternal Grandmother    Breast cancer Maternal Grandmother    Lymphoma Paternal Grandfather    Leukemia Paternal Grandfather    Diabetes Paternal Grandmother    Kidney disease Mother        familial nephritis   Melanoma Mother 23   Basal cell carcinoma Mother    Colon cancer Neg Hx    Stomach cancer Neg Hx    Rectal cancer Neg Hx     Social History   Tobacco Use   Smoking status: Never   Smokeless tobacco: Never  Substance Use Topics   Alcohol use: No    Subjective:    I connected with Misty Austin on 09/15/22 at  1:20 PM EDT by a video enabled telemedicine application and verified that I am speaking with the correct  person using two identifiers.   I discussed the limitations of evaluation and management by telemedicine and the availability of in person appointments. The patient expressed understanding and agreed to proceed  Provider in office/ patient is at home; provider and patient are only 2 people on video call.   Concerned that Vitamin D and/or B12 levels are down again; having recurrent leg pain recently c/w what she has experienced in the past when her levels were down; Doing well on Cymbalta- feels that current dosage is appropriate.      Objective:  Vitals:   09/15/22 1321  Weight: 160 lb (72.6 kg)  Height: '5\' 5"'$  (1.651 m)    General: Well developed, well nourished, in no acute distress  Skin : Warm and dry.  Head: Normocephalic and atraumatic  Lungs: Respirations unlabored;  Neurologic: Alert and oriented; speech intact; face symmetrical;   Assessment:  1. Vitamin D deficiency   2. Low vitamin B12 level   3. Other fatigue     Plan:  She will get her labs updated at [place] at her convenience; follow up to be determined once lab results obtained.   No follow-ups on file.  Orders Placed This Encounter  Procedures   CBC with Differential/Platelet    Standing Status:   Future    Standing Expiration Date:  09/15/2023   Comp Met (CMET)    Standing Status:   Future    Standing Expiration Date:   09/15/2023   Vitamin D (25 hydroxy)    Standing Status:   Future    Standing Expiration Date:   09/15/2023   B12    Standing Status:   Future    Standing Expiration Date:   09/15/2023    Requested Prescriptions    No prescriptions requested or ordered in this encounter

## 2022-09-26 ENCOUNTER — Other Ambulatory Visit (INDEPENDENT_AMBULATORY_CARE_PROVIDER_SITE_OTHER): Payer: Commercial Managed Care - PPO

## 2022-09-26 DIAGNOSIS — E559 Vitamin D deficiency, unspecified: Secondary | ICD-10-CM | POA: Diagnosis not present

## 2022-09-26 DIAGNOSIS — R7989 Other specified abnormal findings of blood chemistry: Secondary | ICD-10-CM | POA: Diagnosis not present

## 2022-09-26 DIAGNOSIS — R5383 Other fatigue: Secondary | ICD-10-CM | POA: Diagnosis not present

## 2022-09-26 LAB — CBC WITH DIFFERENTIAL/PLATELET
Basophils Absolute: 0 10*3/uL (ref 0.0–0.1)
Basophils Relative: 0.8 % (ref 0.0–3.0)
Eosinophils Absolute: 0.5 10*3/uL (ref 0.0–0.7)
Eosinophils Relative: 9.1 % — ABNORMAL HIGH (ref 0.0–5.0)
HCT: 34.2 % — ABNORMAL LOW (ref 36.0–46.0)
Hemoglobin: 12.1 g/dL (ref 12.0–15.0)
Lymphocytes Relative: 29.2 % (ref 12.0–46.0)
Lymphs Abs: 1.5 10*3/uL (ref 0.7–4.0)
MCHC: 35.3 g/dL (ref 30.0–36.0)
MCV: 87.7 fl (ref 78.0–100.0)
Monocytes Absolute: 0.4 10*3/uL (ref 0.1–1.0)
Monocytes Relative: 7.9 % (ref 3.0–12.0)
Neutro Abs: 2.7 10*3/uL (ref 1.4–7.7)
Neutrophils Relative %: 53 % (ref 43.0–77.0)
Platelets: 256 10*3/uL (ref 150.0–400.0)
RBC: 3.91 Mil/uL (ref 3.87–5.11)
RDW: 12.1 % (ref 11.5–15.5)
WBC: 5.1 10*3/uL (ref 4.0–10.5)

## 2022-09-26 LAB — COMPREHENSIVE METABOLIC PANEL
ALT: 10 U/L (ref 0–35)
AST: 12 U/L (ref 0–37)
Albumin: 4.1 g/dL (ref 3.5–5.2)
Alkaline Phosphatase: 68 U/L (ref 39–117)
BUN: 19 mg/dL (ref 6–23)
CO2: 27 mEq/L (ref 19–32)
Calcium: 9 mg/dL (ref 8.4–10.5)
Chloride: 105 mEq/L (ref 96–112)
Creatinine, Ser: 0.79 mg/dL (ref 0.40–1.20)
GFR: 96.15 mL/min (ref >60.00)
Glucose, Bld: 76 mg/dL (ref 70–99)
Potassium: 3.9 mEq/L (ref 3.5–5.1)
Sodium: 139 mEq/L (ref 135–145)
Total Bilirubin: 0.3 mg/dL (ref 0.2–1.2)
Total Protein: 6.6 g/dL (ref 6.0–8.3)

## 2022-09-27 ENCOUNTER — Other Ambulatory Visit (HOSPITAL_BASED_OUTPATIENT_CLINIC_OR_DEPARTMENT_OTHER): Payer: Self-pay

## 2022-09-27 ENCOUNTER — Ambulatory Visit
Admission: RE | Admit: 2022-09-27 | Discharge: 2022-09-27 | Disposition: A | Discharge status: Home / Self Care | Payer: Commercial Managed Care - PPO | Source: Ambulatory Visit | Attending: Family Medicine | Admitting: Family Medicine

## 2022-09-27 VITALS — BP 116/80 | HR 76 | Temp 97.7°F | Resp 20 | Ht 65.0 in | Wt 160.0 lb

## 2022-09-27 DIAGNOSIS — E559 Vitamin D deficiency, unspecified: Secondary | ICD-10-CM | POA: Diagnosis not present

## 2022-09-27 DIAGNOSIS — G43009 Migraine without aura, not intractable, without status migrainosus: Secondary | ICD-10-CM | POA: Diagnosis not present

## 2022-09-27 DIAGNOSIS — E538 Deficiency of other specified B group vitamins: Secondary | ICD-10-CM | POA: Diagnosis not present

## 2022-09-27 LAB — VITAMIN B12: Vitamin B-12: 189 pg/mL — ABNORMAL LOW (ref 211–911)

## 2022-09-27 LAB — VITAMIN D 25 HYDROXY (VIT D DEFICIENCY, FRACTURES): VITD: 15.12 ng/mL — ABNORMAL LOW (ref 30.00–100.00)

## 2022-09-27 MED ORDER — ONDANSETRON 4 MG PO TBDP
4.0000 mg | ORAL_TABLET | Freq: Once | ORAL | Status: AC
  Administered 2022-09-27: 4 mg via ORAL

## 2022-09-27 MED ORDER — KETOROLAC TROMETHAMINE 30 MG/ML IJ SOLN
30.0000 mg | Freq: Once | INTRAMUSCULAR | Status: AC
  Administered 2022-09-27: 30 mg via INTRAMUSCULAR

## 2022-09-27 MED ORDER — KETOROLAC TROMETHAMINE 30 MG/ML IJ SOLN: 30.0000 mg | Freq: Once | INTRAMUSCULAR | Status: DC

## 2022-09-27 MED ORDER — DEXAMETHASONE SODIUM PHOSPHATE 10 MG/ML IJ SOLN
10.0000 mg | Freq: Once | INTRAMUSCULAR | Status: AC
  Administered 2022-09-27: 10 mg via INTRAMUSCULAR

## 2022-09-27 MED ORDER — ONDANSETRON HCL 8 MG PO TABS
8.0000 mg | ORAL_TABLET | Freq: Three times a day (TID) | ORAL | 0 refills | Status: DC | PRN
  Filled 2022-09-27: qty 20, 7d supply, fill #0

## 2022-09-27 NOTE — ED Provider Notes (Signed)
[facility]    CSN: [id number] Arrival date & time: 09/27/22  [id number]      History   Chief Complaint Chief Complaint  Patient presents with   Headache    APPT 945AM   Emesis    HPI Misty Austin is a 37 y.o. female.   HPI  Patient has a history of migraines.  She has them infrequently.  Her last migraine was about 4 years ago.  She states that at the time of her last migraine she was deficient in vitamin B12 and vitamin D.  She had these vitamins checked recently.  She does not yet know the results.  Patient is here today for severe headache.  It is on the right side of her head.  She has nausea, 1 episode of vomiting.  She had no trauma.  No cold or sinus symptoms.  No numbness or weakness in the arms or legs, no change in balance or coordination.  She did have a brief numbness in the right side of her face that has resolved.  No aura.  Slight photophobia.  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   Hx of varicella    IBS (irritable bowel syndrome)    Thyroid nodule    benign folliculr on bx [id number]   Vaginal Pap smear, abnormal     Patient Active Problem List   Diagnosis Date Noted   Migraine without aura and with status migrainosus, not intractable 02/20/2018   Indication for care in labor and delivery, antepartum 05/12/2015   NSVD (normal spontaneous vaginal delivery) 01/29/2013   ABDOMINAL PAIN OTHER SPECIFIED SITE 07/31/2009    Past Surgical History:  Procedure Laterality Date   LAPAROSCOPIC CHOLECYSTECTOMY  09/2009   [facility]   WISDOM TOOTH EXTRACTION      OB History     Gravida  2   Para  2   Term  2   Preterm      AB      Living  2      SAB      IAB      Ectopic      Multiple  0   Live Births  2            Home Medications    Prior  to Admission medications   Medication Sig Start Date End Date Taking? Authorizing Provider  acetaminophen (TYLENOL) 325 MG tablet Take 650 mg by mouth every 6 (six) hours as needed.   Yes [provider]  aspirin-acetaminophen-caffeine (EXCEDRIN MIGRAINE) [phone] MG tablet Take by mouth every 6 (six) hours as needed for headache.   Yes [provider]  ibuprofen (ADVIL) 400 MG tablet Take 400 mg by mouth every 6 (six) hours as needed.   Yes [provider]  ondansetron (ZOFRAN) 8 MG tablet Take 1 tablet (8 mg total) by mouth every 8 (eight) hours as needed for nausea or vomiting. 09/27/22  Yes Raylene Everts, MD  DULoxetine (CYMBALTA) 30 MG capsule Take 1 capsule (30 mg total) by mouth daily. 07/26/22   Marrian Salvage, FNP    Family History Family History  Problem Relation Age of Onset   Esophageal cancer Maternal Grandfather    Diabetes Maternal Grandmother    Breast cancer Maternal Grandmother  Lymphoma Paternal Grandfather    Leukemia Paternal Grandfather    Diabetes Paternal Grandmother    Kidney disease Mother        familial nephritis   Melanoma Mother 14   Basal cell carcinoma Mother    Colon cancer Neg Hx    Stomach cancer Neg Hx    Rectal cancer Neg Hx     Social History Social History   Tobacco Use   Smoking status: Never   Smokeless tobacco: Never  Vaping Use   Vaping Use: Never used  Substance Use Topics   Alcohol use: No   Drug use: No     Allergies   Patient has no known allergies.   Review of Systems Review of Systems  See HPI Physical Exam Triage Vital Signs ED Triage Vitals  Enc Vitals Group     BP 09/27/22 1026 116/80     Pulse Rate 09/27/22 1026 76     Resp 09/27/22 1026 20     Temp 09/27/22 1026 97.7 F (36.5 C)     Temp Source 09/27/22 1026 Oral     SpO2 09/27/22 1026 99 %     Weight 09/27/22 1021 160 lb (72.6 kg)     Height 09/27/22 1021 5\' 5"  (1.651 m)     Head Circumference --      Peak  Flow --      Pain Score 09/27/22 1019 10     Pain Loc --      Pain Edu? --      Excl. in [place]? --    No data found.  Updated Vital Signs BP 116/80   Pulse 76   Temp 97.7 F (36.5 C) (Oral)   Resp 20   Ht 5\' 5"  (1.651 m)   Wt 72.6 kg   LMP 09/25/2022 (Exact Date)   SpO2 99%   BMI 26.63 kg/m      Physical Exam Constitutional:      General: She is not in acute distress.    Appearance: She is well-developed. She is ill-appearing.     Comments: Acutely uncomfortable in darkened room  HENT:     Head: Normocephalic and atraumatic.  Eyes:     General: No visual field deficit.    Extraocular Movements: Extraocular movements intact.     Right eye: Normal extraocular motion and no nystagmus.     Left eye: Normal extraocular motion and no nystagmus.     Conjunctiva/sclera: Conjunctivae normal.     Pupils: Pupils are equal, round, and reactive to light.     Right eye: Pupil is round and reactive.     Left eye: Pupil is round and reactive.  Cardiovascular:     Rate and Rhythm: Normal rate.  Pulmonary:     Effort: Pulmonary effort is normal. No respiratory distress.  Abdominal:     General: There is no distension.     Palpations: Abdomen is soft.  Musculoskeletal:        General: Normal range of motion.     Cervical back: Normal range of motion and neck supple.  Skin:    General: Skin is warm and dry.  Neurological:     Mental Status: She is alert. Mental status is at baseline.     Cranial Nerves: No cranial nerve deficit or dysarthria.     Gait: Gait normal.  Psychiatric:        Mood and Affect: Mood normal.        Speech: Speech normal.  UC Treatments / Results  Labs (all labs ordered are listed, but only abnormal results are displayed) Labs Reviewed - No data to display  EKG   Radiology No results found.  Procedures Procedures (including critical care time)  Medications Ordered in UC Medications  ondansetron (ZOFRAN-ODT) disintegrating tablet 4 mg (4  mg Oral Given 09/27/22 1031)  dexamethasone (DECADRON) injection 10 mg (10 mg Intramuscular Given 09/27/22 1041)  ketorolac (TORADOL) 30 MG/ML injection 30 mg (30 mg Intramuscular Given 09/27/22 1044)    Initial Impression / Assessment and Plan / UC Course  I have reviewed the triage vital signs and the nursing notes.  Pertinent labs & imaging results that were available during my care of the patient were reviewed by me and considered in my medical decision making (see chart for details).     Patient responded promptly to injections of dexamethasone and Toradol with improvement within 20 minutes.  The nausea was little recalcitrant and she required a second dose of Zofran.  Overall at the time of discharge patient feels nearly back to normal  Did check her lab work.  Her vitamin D is in the 15 range, B12 is 189.  Both of these are deficient.  Patient is advised to take supplementation as prescribed and follow-up with her PCP Final Clinical Impressions(s) / UC Diagnoses   Final diagnoses:  Atypical migraine  Vitamin B 12 deficiency  Vitamin D deficiency     Discharge Instructions      Take Zofran as needed for nausea Contact your doctor about your vitamin deficiencies Return as needed   ED Prescriptions     Medication Sig Dispense Auth. Provider   ondansetron (ZOFRAN) 8 MG tablet Take 1 tablet (8 mg total) by mouth every 8 (eight) hours as needed for nausea or vomiting. 20 tablet Raylene Everts, MD      PDMP not reviewed this encounter.   Raylene Everts, MD 09/27/22 [phone]

## 2022-09-27 NOTE — Discharge Instructions (Signed)
Take Zofran as needed for nausea Contact your doctor about your vitamin deficiencies Return as needed

## 2022-09-27 NOTE — ED Triage Notes (Addendum)
Pt presents to Urgent Care with c/o severe, worsening pain to R side of head, face, and neck since yesterday morning. Reports nausea w/ one episode of emesis today. Denies visual problems. Grips equal and strong, tongue midline, smile symmetrical. Reports one episode of numbness to R side of face this morning. Has taken Tylenol, ibuprofen, and Excedrin without relief. Denies photosensitivity.

## 2022-09-28 ENCOUNTER — Telehealth: Payer: Self-pay | Admitting: Emergency Medicine

## 2022-09-28 NOTE — Telephone Encounter (Signed)
Call to see how patient was today - no answer

## 2022-10-04 ENCOUNTER — Other Ambulatory Visit: Payer: Self-pay | Admitting: Family

## 2022-10-04 ENCOUNTER — Other Ambulatory Visit (HOSPITAL_BASED_OUTPATIENT_CLINIC_OR_DEPARTMENT_OTHER): Payer: Self-pay

## 2022-10-04 ENCOUNTER — Encounter: Payer: Self-pay | Admitting: Family

## 2022-10-04 MED ORDER — CYANOCOBALAMIN 1000 MCG/ML IJ SOLN
1000.0000 ug | INTRAMUSCULAR | 0 refills | Status: DC
  Filled 2022-10-04: qty 10, 70d supply, fill #0

## 2022-10-04 MED ORDER — "NEEDLE (DISP) 25G X 1"" MISC"
0 refills | Status: DC
  Filled 2022-10-04: qty 50, 12d supply, fill #0

## 2022-10-04 MED ORDER — VITAMIN D (ERGOCALCIFEROL) 1.25 MG (50000 UNIT) PO CAPS
50000.0000 [IU] | ORAL_CAPSULE | ORAL | 0 refills | Status: AC
  Filled 2022-10-04: qty 12, 84d supply, fill #0

## 2022-10-05 ENCOUNTER — Other Ambulatory Visit (HOSPITAL_BASED_OUTPATIENT_CLINIC_OR_DEPARTMENT_OTHER): Payer: Self-pay

## 2022-11-25 DIAGNOSIS — F419 Anxiety disorder, unspecified: Secondary | ICD-10-CM | POA: Diagnosis not present

## 2022-11-25 DIAGNOSIS — E559 Vitamin D deficiency, unspecified: Secondary | ICD-10-CM | POA: Diagnosis not present

## 2022-11-25 DIAGNOSIS — N289 Disorder of kidney and ureter, unspecified: Secondary | ICD-10-CM | POA: Diagnosis not present

## 2022-11-25 DIAGNOSIS — Z131 Encounter for screening for diabetes mellitus: Secondary | ICD-10-CM | POA: Diagnosis not present

## 2022-11-25 DIAGNOSIS — E663 Overweight: Secondary | ICD-10-CM | POA: Diagnosis not present

## 2022-11-25 DIAGNOSIS — R5382 Chronic fatigue, unspecified: Secondary | ICD-10-CM | POA: Diagnosis not present

## 2022-11-25 DIAGNOSIS — E785 Hyperlipidemia, unspecified: Secondary | ICD-10-CM | POA: Diagnosis not present

## 2022-11-25 DIAGNOSIS — N951 Menopausal and female climacteric states: Secondary | ICD-10-CM | POA: Diagnosis not present

## 2022-11-29 DIAGNOSIS — R5382 Chronic fatigue, unspecified: Secondary | ICD-10-CM | POA: Diagnosis not present

## 2022-11-29 DIAGNOSIS — E559 Vitamin D deficiency, unspecified: Secondary | ICD-10-CM | POA: Diagnosis not present

## 2022-11-29 DIAGNOSIS — G479 Sleep disorder, unspecified: Secondary | ICD-10-CM | POA: Diagnosis not present

## 2022-11-29 DIAGNOSIS — N898 Other specified noninflammatory disorders of vagina: Secondary | ICD-10-CM | POA: Diagnosis not present

## 2022-11-29 DIAGNOSIS — M2550 Pain in unspecified joint: Secondary | ICD-10-CM | POA: Diagnosis not present

## 2022-11-29 DIAGNOSIS — R4589 Other symptoms and signs involving emotional state: Secondary | ICD-10-CM | POA: Diagnosis not present

## 2022-11-29 DIAGNOSIS — R454 Irritability and anger: Secondary | ICD-10-CM | POA: Diagnosis not present

## 2022-11-29 DIAGNOSIS — Z6827 Body mass index (BMI) 27.0-27.9, adult: Secondary | ICD-10-CM | POA: Diagnosis not present

## 2022-11-29 DIAGNOSIS — E785 Hyperlipidemia, unspecified: Secondary | ICD-10-CM | POA: Diagnosis not present

## 2022-11-29 DIAGNOSIS — N951 Menopausal and female climacteric states: Secondary | ICD-10-CM | POA: Diagnosis not present

## 2022-12-12 DIAGNOSIS — R4589 Other symptoms and signs involving emotional state: Secondary | ICD-10-CM | POA: Diagnosis not present

## 2022-12-12 DIAGNOSIS — E785 Hyperlipidemia, unspecified: Secondary | ICD-10-CM | POA: Diagnosis not present

## 2022-12-12 DIAGNOSIS — E559 Vitamin D deficiency, unspecified: Secondary | ICD-10-CM | POA: Diagnosis not present

## 2022-12-12 DIAGNOSIS — Z6827 Body mass index (BMI) 27.0-27.9, adult: Secondary | ICD-10-CM | POA: Diagnosis not present

## 2023-01-01 ENCOUNTER — Telehealth: Payer: Commercial Managed Care - PPO | Admitting: Family Medicine

## 2023-01-01 DIAGNOSIS — B9689 Other specified bacterial agents as the cause of diseases classified elsewhere: Secondary | ICD-10-CM | POA: Diagnosis not present

## 2023-01-01 DIAGNOSIS — J019 Acute sinusitis, unspecified: Secondary | ICD-10-CM

## 2023-01-01 MED ORDER — AMOXICILLIN-POT CLAVULANATE 875-125 MG PO TABS: 1.0000 | ORAL_TABLET | Freq: Two times a day (BID) | ORAL | 0 refills | Status: DC

## 2023-01-01 NOTE — Progress Notes (Signed)

## 2023-03-27 ENCOUNTER — Other Ambulatory Visit: Payer: Self-pay | Admitting: Family

## 2023-03-27 ENCOUNTER — Encounter: Payer: Self-pay | Admitting: Family

## 2023-03-27 ENCOUNTER — Other Ambulatory Visit (HOSPITAL_COMMUNITY): Payer: Self-pay

## 2023-03-29 ENCOUNTER — Other Ambulatory Visit: Payer: Self-pay | Admitting: Family

## 2023-03-29 ENCOUNTER — Other Ambulatory Visit (HOSPITAL_COMMUNITY): Payer: Self-pay

## 2023-03-30 ENCOUNTER — Other Ambulatory Visit (HOSPITAL_BASED_OUTPATIENT_CLINIC_OR_DEPARTMENT_OTHER): Payer: Self-pay

## 2023-03-30 ENCOUNTER — Other Ambulatory Visit: Payer: Self-pay | Admitting: Family

## 2023-03-30 MED ORDER — DULOXETINE HCL 30 MG PO CPEP
30.0000 mg | ORAL_CAPSULE | Freq: Every day | ORAL | 1 refills | Status: DC
  Filled 2023-03-30 – 2023-04-03 (×2): qty 90, 90d supply, fill #0
  Filled 2023-09-06: qty 90, 90d supply, fill #1

## 2023-04-03 ENCOUNTER — Other Ambulatory Visit (HOSPITAL_COMMUNITY): Payer: Self-pay

## 2023-04-04 ENCOUNTER — Other Ambulatory Visit (HOSPITAL_BASED_OUTPATIENT_CLINIC_OR_DEPARTMENT_OTHER): Payer: Self-pay

## 2023-04-19 DIAGNOSIS — Z01419 Encounter for gynecological examination (general) (routine) without abnormal findings: Secondary | ICD-10-CM | POA: Diagnosis not present

## 2023-04-19 DIAGNOSIS — K625 Hemorrhage of anus and rectum: Secondary | ICD-10-CM | POA: Diagnosis not present

## 2023-04-19 DIAGNOSIS — Q514 Unicornate uterus: Secondary | ICD-10-CM | POA: Diagnosis not present

## 2023-04-19 DIAGNOSIS — Z1389 Encounter for screening for other disorder: Secondary | ICD-10-CM | POA: Diagnosis not present

## 2023-04-25 ENCOUNTER — Encounter: Payer: Self-pay | Admitting: Emergency Medicine

## 2023-04-25 ENCOUNTER — Ambulatory Visit
Admission: EM | Admit: 2023-04-25 | Discharge: 2023-04-25 | Disposition: A | Discharge status: Home / Self Care | Payer: Commercial Managed Care - PPO | Attending: Family Medicine | Admitting: Family Medicine

## 2023-04-25 ENCOUNTER — Other Ambulatory Visit (HOSPITAL_BASED_OUTPATIENT_CLINIC_OR_DEPARTMENT_OTHER): Payer: Self-pay

## 2023-04-25 DIAGNOSIS — Z20818 Contact with and (suspected) exposure to other bacterial communicable diseases: Secondary | ICD-10-CM | POA: Diagnosis not present

## 2023-04-25 DIAGNOSIS — J029 Acute pharyngitis, unspecified: Secondary | ICD-10-CM | POA: Diagnosis not present

## 2023-04-25 DIAGNOSIS — R590 Localized enlarged lymph nodes: Secondary | ICD-10-CM | POA: Insufficient documentation

## 2023-04-25 DIAGNOSIS — R5383 Other fatigue: Secondary | ICD-10-CM | POA: Insufficient documentation

## 2023-04-25 LAB — POCT RAPID STREP A (OFFICE): Rapid Strep A Screen: NEGATIVE

## 2023-04-25 MED ORDER — AMOXICILLIN 500 MG PO CAPS
500.0000 mg | ORAL_CAPSULE | Freq: Two times a day (BID) | ORAL | 0 refills | Status: DC
  Filled 2023-04-25: qty 20, 10d supply, fill #0

## 2023-04-25 NOTE — Discharge Instructions (Signed)
Take the amoxicillin 2 times a day for 10 full days  Check MyChart for your throat culture report

## 2023-04-25 NOTE — ED Provider Notes (Signed)
[facility]    CSN: [id number] Arrival date & time: 04/25/23  [id number]      History   Chief Complaint Chief Complaint  Patient presents with   Sore Throat    HPI Misty Austin is a 37 y.o. female.   Patient states that her son tested positive for strep several days ago.  Since yesterday the patient has had an increasingly painful sore throat.  Swollen glands in her neck.  Feeling tired.  Concern for strep.    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   Hx of varicella    IBS (irritable bowel syndrome)    Thyroid nodule    benign folliculr on bx 09/2013   Vaginal Pap smear, abnormal     Patient Active Problem List   Diagnosis Date Noted   Migraine without aura and with status migrainosus, not intractable 02/20/2018   Indication for care in labor and delivery, antepartum 05/12/2015   NSVD (normal spontaneous vaginal delivery) 01/29/2013   ABDOMINAL PAIN OTHER SPECIFIED SITE 07/31/2009    Past Surgical History:  Procedure Laterality Date   LAPAROSCOPIC CHOLECYSTECTOMY  09/2009   [facility]   WISDOM TOOTH EXTRACTION      OB History     Gravida  2   Para  2   Term  2   Preterm      AB      Living  2      SAB      IAB      Ectopic      Multiple  0   Live Births  2            Home Medications    Prior to Admission medications   Medication Sig Start Date End Date Taking? Authorizing Provider  amoxicillin (AMOXIL) 500 MG capsule Take 1 capsule (500 mg total) by mouth 2 (two) times daily. 04/25/23  Yes Eustace Moore, MD  cyanocobalamin (VITAMIN B12) 1000 MCG/ML injection Inject 1 mL (1,000 mcg total) into the muscle once a week. 10/04/22  Yes Olive Bass, FNP  DULoxetine (CYMBALTA) 30 MG capsule Take 1 capsule (30 mg total) by mouth daily.  03/30/23  Yes Olive Bass, FNP    Family History Family History  Problem Relation Age of Onset   Esophageal cancer Maternal Grandfather    Diabetes Maternal Grandmother    Breast cancer Maternal Grandmother    Lymphoma Paternal Grandfather    Leukemia Paternal Grandfather    Diabetes Paternal Grandmother    Kidney disease Mother        familial nephritis   Melanoma Mother 15   Basal cell carcinoma Mother    Colon cancer Neg Hx    Stomach cancer Neg Hx    Rectal cancer Neg Hx     Social History Social History   Tobacco Use   Smoking status: Never   Smokeless tobacco: Never  Vaping Use   Vaping status: Never Used  Substance Use Topics   Alcohol use: No   Drug use: No     Allergies   Patient has no known allergies.   Review of Systems Review of Systems See HPI  Physical Exam Triage Vital Signs ED Triage Vitals  Encounter Vitals Group     BP 04/25/23 0839 109/73  Systolic BP Percentile --      Diastolic BP Percentile --      Pulse Rate 04/25/23 0839 80     Resp 04/25/23 0839 16     Temp 04/25/23 0839 98.4 F (36.9 C)     Temp Source 04/25/23 0839 Oral     SpO2 04/25/23 0839 97 %     Weight 04/25/23 0841 165 lb (74.8 kg)     Height 04/25/23 0841 5\' 5"  (1.651 m)     Head Circumference --      Peak Flow --      Pain Score 04/25/23 0841 3     Pain Loc --      Pain Education --      Exclude from Growth Chart --    No data found.  Updated Vital Signs BP 109/73 (BP Location: Right Arm)   Pulse 80   Temp 98.4 F (36.9 C) (Oral)   Resp 16   Ht 5\' 5"  (1.651 m)   Wt 74.8 kg   LMP 04/04/2023   SpO2 97%   BMI 27.46 kg/m      Physical Exam Constitutional:      General: She is not in acute distress.    Appearance: She is well-developed and normal weight.  HENT:     Head: Normocephalic and atraumatic.     Right Ear: Tympanic membrane and ear canal normal.     Left Ear: Tympanic membrane and ear canal normal.     Nose: No congestion or  rhinorrhea.     Mouth/Throat:     Pharynx: Uvula midline. Pharyngeal swelling and posterior oropharyngeal erythema present.     Tonsils: No tonsillar exudate. 2+ on the right. 2+ on the left.  Eyes:     Conjunctiva/sclera: Conjunctivae normal.     Pupils: Pupils are equal, round, and reactive to light.  Cardiovascular:     Rate and Rhythm: Normal rate.  Pulmonary:     Effort: Pulmonary effort is normal. No respiratory distress.  Abdominal:     General: There is no distension.     Palpations: Abdomen is soft.  Musculoskeletal:        General: Normal range of motion.     Cervical back: Normal range of motion.  Lymphadenopathy:     Cervical: Cervical adenopathy present.  Skin:    General: Skin is warm and dry.  Neurological:     Mental Status: She is alert.      UC Treatments / Results  Labs (all labs ordered are listed, but only abnormal results are displayed) Labs Reviewed  CULTURE, GROUP A STREP [facility])  POCT RAPID STREP A (OFFICE)    EKG   Radiology No results found.  Procedures Procedures (including critical care time)  Medications Ordered in UC Medications - No data to display  Initial Impression / Assessment and Plan / UC Course  I have reviewed the triage vital signs and the nursing notes.  Pertinent labs & imaging results that were available during my care of the patient were reviewed by me and considered in my medical decision making (see chart for details).     Even though the rapid strep test is negative I have concern regarding direct strep exposure and the appearance of the patient's throat.  I am going to give her 10 days of antibiotics.  Discussed complications of untreated strep. Final Clinical Impressions(s) / UC Diagnoses   Final diagnoses:  Sore throat  Exposure to strep throat  Discharge Instructions      Take the amoxicillin 2 times a day for 10 full days  Check MyChart for your throat culture report   ED Prescriptions      Medication Sig Dispense Auth. Provider   amoxicillin (AMOXIL) 500 MG capsule Take 1 capsule (500 mg total) by mouth 2 (two) times daily. 20 capsule Eustace Moore, MD      PDMP not reviewed this encounter.   Eustace Moore, MD 04/25/23 [phone]

## 2023-04-25 NOTE — ED Triage Notes (Signed)
Patient states her daughter was diagnosed w/strep throat on Thursday.  Patient began having body aches, neck pain, low grade fever and headache yesterday.  Patient denies any OTC meds.

## 2023-04-27 LAB — CULTURE, GROUP A STREP (THRC)

## 2023-05-22 ENCOUNTER — Ambulatory Visit
Admission: EM | Admit: 2023-05-22 | Discharge: 2023-05-22 | Disposition: A | Discharge status: Home / Self Care | Payer: Commercial Managed Care - PPO | Attending: Family Medicine | Admitting: Family Medicine

## 2023-05-22 ENCOUNTER — Encounter: Payer: Self-pay | Admitting: Emergency Medicine

## 2023-05-22 DIAGNOSIS — Z20828 Contact with and (suspected) exposure to other viral communicable diseases: Secondary | ICD-10-CM | POA: Diagnosis not present

## 2023-05-22 DIAGNOSIS — B9789 Other viral agents as the cause of diseases classified elsewhere: Secondary | ICD-10-CM | POA: Insufficient documentation

## 2023-05-22 DIAGNOSIS — J029 Acute pharyngitis, unspecified: Secondary | ICD-10-CM | POA: Diagnosis not present

## 2023-05-22 DIAGNOSIS — J028 Acute pharyngitis due to other specified organisms: Secondary | ICD-10-CM | POA: Insufficient documentation

## 2023-05-22 LAB — POCT RAPID STREP A (OFFICE): Rapid Strep A Screen: NEGATIVE

## 2023-05-22 NOTE — ED Provider Notes (Signed)
[facility]    CSN: [id number] Arrival date & time: 05/22/23  6962      History   Chief Complaint Chief Complaint  Patient presents with   Sore Throat    HPI Misty Austin is a 37 y.o. female.   HPI  I saw patient 3 to 4 weeks ago for strep exposure and a severe sore throat.  Treated her with amoxicillin.  She is here today with another child exposing her to strep.  Both of her children have been sick.  She has a sore throat but this time has runny nose and cough symptoms as well.  She has had symptoms for just 2 days.  No fever or chills.  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   Hx of varicella    IBS (irritable bowel syndrome)    Thyroid nodule    benign folliculr on bx 09/2013   Vaginal Pap smear, abnormal     Patient Active Problem List   Diagnosis Date Noted   Migraine without aura and with status migrainosus, not intractable 02/20/2018   Indication for care in labor and delivery, antepartum 05/12/2015   NSVD (normal spontaneous vaginal delivery) 01/29/2013   ABDOMINAL PAIN OTHER SPECIFIED SITE 07/31/2009    Past Surgical History:  Procedure Laterality Date   LAPAROSCOPIC CHOLECYSTECTOMY  09/2009   [facility]   WISDOM TOOTH EXTRACTION      OB History     Gravida  2   Para  2   Term  2   Preterm      AB      Living  2      SAB      IAB      Ectopic      Multiple  0   Live Births  2            Home Medications    Prior to Admission medications   Medication Sig Start Date End Date Taking? Authorizing Provider  cyanocobalamin (VITAMIN B12) 1000 MCG/ML injection Inject 1 mL (1,000 mcg total) into the muscle once a week. 10/04/22  Yes Olive Bass, FNP  DULoxetine (CYMBALTA) 30 MG capsule Take 1 capsule (30 mg  total) by mouth daily. 03/30/23  Yes Olive Bass, FNP  amoxicillin (AMOXIL) 500 MG capsule Take 1 capsule (500 mg total) by mouth 2 (two) times daily. 04/25/23   Eustace Moore, MD    Family History Family History  Problem Relation Age of Onset   Esophageal cancer Maternal Grandfather    Diabetes Maternal Grandmother    Breast cancer Maternal Grandmother    Lymphoma Paternal Grandfather    Leukemia Paternal Grandfather    Diabetes Paternal Grandmother    Kidney disease Mother        familial nephritis   Melanoma Mother 54   Basal cell carcinoma Mother    Colon cancer Neg Hx    Stomach cancer Neg Hx    Rectal cancer Neg Hx     Social History Social History   Tobacco Use   Smoking status: Never   Smokeless tobacco: Never  Vaping Use   Vaping status: Never Used  Substance Use Topics   Alcohol use: No   Drug use: No  Allergies   Patient has no known allergies.   Review of Systems Review of Systems See HPI  Physical Exam Triage Vital Signs ED Triage Vitals  Encounter Vitals Group     BP 05/22/23 0848 113/75     Systolic BP Percentile --      Diastolic BP Percentile --      Pulse Rate 05/22/23 0848 85     Resp 05/22/23 0848 18     Temp 05/22/23 0848 99 F (37.2 C)     Temp Source 05/22/23 0848 Oral     SpO2 05/22/23 0848 96 %     Weight 05/22/23 0850 167 lb (75.8 kg)     Height 05/22/23 0850 5\' 5"  (1.651 m)     Head Circumference --      Peak Flow --      Pain Score 05/22/23 0850 3     Pain Loc --      Pain Education --      Exclude from Growth Chart --    No data found.  Updated Vital Signs BP 113/75 (BP Location: Right Arm)   Pulse 85   Temp 99 F (37.2 C) (Oral)   Resp 18   Ht 5\' 5"  (1.651 m)   Wt 75.8 kg   LMP 05/08/2023 (Exact Date)   SpO2 96%   BMI 27.79 kg/m      Physical Exam Constitutional:      General: She is not in acute distress.    Appearance: She is well-developed.  HENT:     Head: Normocephalic and  atraumatic.     Mouth/Throat:     Mouth: Mucous membranes are moist.     Pharynx: Uvula midline. Pharyngeal swelling and posterior oropharyngeal erythema present.     Tonsils: No tonsillar exudate. 0 on the right. 0 on the left.  Eyes:     Conjunctiva/sclera: Conjunctivae normal.     Pupils: Pupils are equal, round, and reactive to light.  Cardiovascular:     Rate and Rhythm: Normal rate.  Pulmonary:     Effort: Pulmonary effort is normal. No respiratory distress.  Abdominal:     General: There is no distension.     Palpations: Abdomen is soft.  Musculoskeletal:        General: Normal range of motion.     Cervical back: Normal range of motion.  Lymphadenopathy:     Cervical: No cervical adenopathy.  Skin:    General: Skin is warm and dry.  Neurological:     Mental Status: She is alert.      UC Treatments / Results  Labs (all labs ordered are listed, but only abnormal results are displayed) Labs Reviewed  CULTURE, GROUP A STREP [facility])  POCT RAPID STREP A (OFFICE)    EKG   Radiology No results found.  Procedures Procedures (including critical care time)  Medications Ordered in UC Medications - No data to display  Initial Impression / Assessment and Plan / UC Course  I have reviewed the triage vital signs and the nursing notes.  Pertinent labs & imaging results that were available during my care of the patient were reviewed by me and considered in my medical decision making (see chart for details).     Rapid strep test is negative.  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   Eustace Moore, MD 05/22/23 1351

## 2023-05-22 NOTE — Discharge Instructions (Signed)
Check my chart for your culture

## 2023-05-22 NOTE — ED Triage Notes (Signed)
Patient states her diagnosed w/strep throat on Saturday, mom started having a sore throat on Saturday.  Mom is also having chest congestion, stuffy nose, denies any ear pain.  Patient has taken Tylenol and Advil.

## 2023-05-24 LAB — CULTURE, GROUP A STREP (THRC)

## 2023-06-14 ENCOUNTER — Telehealth: Payer: Commercial Managed Care - PPO | Admitting: Physician Assistant

## 2023-06-14 ENCOUNTER — Other Ambulatory Visit (HOSPITAL_COMMUNITY): Payer: Self-pay

## 2023-06-14 DIAGNOSIS — J019 Acute sinusitis, unspecified: Secondary | ICD-10-CM | POA: Diagnosis not present

## 2023-06-14 DIAGNOSIS — B9689 Other specified bacterial agents as the cause of diseases classified elsewhere: Secondary | ICD-10-CM | POA: Diagnosis not present

## 2023-06-14 MED ORDER — DOXYCYCLINE HYCLATE 100 MG PO TABS
100.0000 mg | ORAL_TABLET | Freq: Two times a day (BID) | ORAL | 0 refills | Status: DC
  Filled 2023-06-14 – 2023-06-16 (×2): qty 20, 10d supply, fill #0

## 2023-06-14 NOTE — Progress Notes (Signed)
I have spent 5 minutes in review of e-visit questionnaire, review and updating patient chart, medical decision making and response to patient.   Misty Austin Cody Jacklynn Dehaas, PA-C    

## 2023-06-14 NOTE — Progress Notes (Signed)

## 2023-06-16 ENCOUNTER — Other Ambulatory Visit (HOSPITAL_BASED_OUTPATIENT_CLINIC_OR_DEPARTMENT_OTHER): Payer: Self-pay

## 2023-06-16 ENCOUNTER — Other Ambulatory Visit (HOSPITAL_COMMUNITY): Payer: Self-pay

## 2023-07-03 DIAGNOSIS — H5203 Hypermetropia, bilateral: Secondary | ICD-10-CM | POA: Diagnosis not present

## 2023-07-19 ENCOUNTER — Ambulatory Visit: Payer: Commercial Managed Care - PPO | Admitting: Gastroenterology

## 2023-07-19 ENCOUNTER — Other Ambulatory Visit (INDEPENDENT_AMBULATORY_CARE_PROVIDER_SITE_OTHER): Payer: Commercial Managed Care - PPO

## 2023-07-19 ENCOUNTER — Other Ambulatory Visit (HOSPITAL_COMMUNITY): Payer: Self-pay

## 2023-07-19 VITALS — BP 122/64 | HR 68 | Ht 65.0 in | Wt 172.4 lb

## 2023-07-19 DIAGNOSIS — E538 Deficiency of other specified B group vitamins: Secondary | ICD-10-CM

## 2023-07-19 DIAGNOSIS — K219 Gastro-esophageal reflux disease without esophagitis: Secondary | ICD-10-CM | POA: Diagnosis not present

## 2023-07-19 DIAGNOSIS — K5900 Constipation, unspecified: Secondary | ICD-10-CM

## 2023-07-19 DIAGNOSIS — K921 Melena: Secondary | ICD-10-CM

## 2023-07-19 DIAGNOSIS — R140 Abdominal distension (gaseous): Secondary | ICD-10-CM | POA: Diagnosis not present

## 2023-07-19 DIAGNOSIS — R194 Change in bowel habit: Secondary | ICD-10-CM

## 2023-07-19 DIAGNOSIS — E559 Vitamin D deficiency, unspecified: Secondary | ICD-10-CM

## 2023-07-19 DIAGNOSIS — R1310 Dysphagia, unspecified: Secondary | ICD-10-CM

## 2023-07-19 LAB — IBC + FERRITIN
Ferritin: 22.8 ng/mL (ref 10.0–291.0)
Iron: 92 ug/dL (ref 42–145)
Saturation Ratios: 27.3 % (ref 20.0–50.0)
TIBC: 337.4 ug/dL (ref 250.0–450.0)
Transferrin: 241 mg/dL (ref 212.0–360.0)

## 2023-07-19 LAB — B12 AND FOLATE PANEL
Folate: 12.9 ng/mL (ref >5.9)
Vitamin B-12: 252 pg/mL (ref 211–911)

## 2023-07-19 LAB — CBC
HCT: 35.3 % — ABNORMAL LOW (ref 36.0–46.0)
Hemoglobin: 12.1 g/dL (ref 12.0–15.0)
MCHC: 34.4 g/dL (ref 30.0–36.0)
MCV: 90.2 fL (ref 78.0–100.0)
Platelets: 288 10*3/uL (ref 150.0–400.0)
RBC: 3.92 Mil/uL (ref 3.87–5.11)
RDW: 12.3 % (ref 11.5–15.5)
WBC: 5.1 10*3/uL (ref 4.0–10.5)

## 2023-07-19 LAB — VITAMIN D 25 HYDROXY (VIT D DEFICIENCY, FRACTURES): VITD: 16.09 ng/mL — ABNORMAL LOW (ref 30.00–100.00)

## 2023-07-19 MED ORDER — PANTOPRAZOLE SODIUM 40 MG PO TBEC
40.0000 mg | DELAYED_RELEASE_TABLET | Freq: Every morning | ORAL | 6 refills | Status: AC
  Filled 2023-07-19: qty 30, 30d supply, fill #0
  Filled 2023-10-29: qty 30, 30d supply, fill #1
  Filled 2023-11-29: qty 30, 30d supply, fill #2
  Filled 2024-02-01: qty 30, 30d supply, fill #3
  Filled 2024-03-21: qty 30, 30d supply, fill #4
  Filled 2024-06-11: qty 30, 30d supply, fill #5

## 2023-07-19 NOTE — Patient Instructions (Addendum)
 _______________________________________________________  If your blood pressure at your visit was 140/90 or greater, please contact your primary care physician to follow up on this.  If you are age 38 or younger, your body mass index should be between 19-25. Your Body mass index is 28.68 kg/m. If this is out of the aformentioned range listed, please consider follow up with your Primary Care Provider.  ________________________________________________________  The [facility] providers would like to encourage you to use MYCHART to communicate with providers for non-urgent requests or questions.  Due to long hold times on the telephone, sending your provider a message by [facility] may be a faster and more efficient way to get a response.  Please allow 48 business hours for a response.  Please remember that this is for non-urgent requests.  _______________________________________________________  Your provider has requested that you go to the basement level for lab work before leaving today. Press "B" on the elevator. The lab is located at the first door on the left as you exit the elevator.  Your provider has requested that you go to the basement level for lab work in 4 weeks (08-16-23). You do not need an appointment for lab work.  Press "B" on the elevator. The lab is located at the first door on the left as you exit the elevator.  We have sent the following medications to your pharmacy for you to pick up at your convenience:  START: Protonix  40mg  one tablet every morning.  Please purchase the following medications over the counter and take as directed:  START: Miralax 1 capful daily  Due to recent changes in healthcare laws, you may see the results of your imaging and laboratory studies on MyChart before your provider has had a chance to review them.  We understand that in some cases there may be results that are confusing or concerning to you. Not all laboratory results come back in the same time  frame and the provider may be waiting for multiple results in order to interpret others.  Please give us  48 hours in order for your provider to thoroughly review all the results before contacting the office for clarification of your results.   It was a pleasure to see you today!  Vito Cirigliano, D.O.

## 2023-07-19 NOTE — Progress Notes (Signed)
 Chief Complaint:    GERD, rectal bleeding, dysphagia, constipation  GI History: 38 year old female with history of anxiety/depression, cholecystectomy, follows in GI clinic for the following:  1) GERD.  Index symptoms of heartburn.  Was having dysphagia 1-2 times per month.  Seen by Dr. Howard Macho for this issue in 08/20/2020.  Was on Pepcid  20 mg daily at that time. - 09/02/2020: EGD: Normal esophagus (path consistent with reflux changes), normal stomach, normal duodenum (path benign).  Was changed to Prilosec 40 mg every morning  2) B12 deficiency.  HPI:     Patient is a 38 y.o. female presenting to the Gastroenterology Clinic for evaluation of GERD, dysphagia, rectal bleeding, constipation.  Was last seen by Dr. Howard Macho on 08/20/2020 for evaluation of reflux, intermittent dysphagia, weight gain, B12 deficiency.  Recommended EGD and continued on Pepcid  20 mg daily.  EGD with normal-appearing esophagus, but esophageal biopsies consistent with reflux changes.  Was changed from Pepcid  to omeprazole  40 mg every morning.  Today, she states reflux has worsened over the last month or so along with return of dysphagia.  Currently taking Pepcid  20 mg BID but still with heartburn, solid food dysphagia.   FHx of mother with chronic familial nephrititis (Alport Syndrome?) and per patient was told to consider avoiding PPIs.  She otherwise has preserved renal function.  Separately, has been having constipation.  Has had consitpation intermittently for many years. Can go 3-4 days without BM.  Has used MiraLAX or Colace on demand with improvement.  Has noted BRB on tissue paper, worse over the last 6 months or so. Tried proctofoam  without much improvement.  Does have associated abdominal bloating and lower abdominal discomfort.  Additionally, history of B12 and vitamin D  deficiency.  Was started on ergocalciferol  and B12 injections in 09/2022.  No repeat labs for review since then.  Maternal Grandfather with  Esophageal CA, mother with Barrett's.  No known family history of CRC, Celiac Disease, or IBD.       Latest Ref Rng & Units 09/26/2022    8:49 AM 03/15/2022   11:26 AM 10/22/2021    8:30 AM  CBC  WBC 4.0 - 10.5 K/uL 5.1  5.1  3.8   Hemoglobin 12.0 - 15.0 g/dL 40.9  81.1  91.4   Hematocrit 36.0 - 46.0 % 34.2  34.2  34.1   Platelets 150.0 - 400.0 K/uL 256.0  242.0  234.0       Latest Ref Rng & Units 09/26/2022    8:49 AM 03/15/2022   11:26 AM 10/22/2021    8:30 AM  CMP  Glucose 70 - 99 mg/dL 76  90  79   BUN 6 - 23 mg/dL 19  16  19    Creatinine 0.40 - 1.20 mg/dL 7.82  9.56  2.13   Sodium 135 - 145 mEq/L 139  137  139   Potassium 3.5 - 5.1 mEq/L 3.9  4.0  4.1   Chloride 96 - 112 mEq/L 105  102  106   CO2 19 - 32 mEq/L 27  30  25    Calcium 8.4 - 10.5 mg/dL 9.0  9.0  8.7   Total Protein 6.0 - 8.3 g/dL 6.6  6.7  6.6   Total Bilirubin 0.2 - 1.2 mg/dL 0.3  0.2  0.3   Alkaline Phos 39 - 117 U/L 68  75  60   AST 0 - 37 U/L 12  14  14    ALT 0 - 35 U/L 10  12  13      Review of systems:     No chest pain, no SOB, no fevers, no urinary sx   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 90 capsule 1   famotidine  (PEPCID ) 20 MG tablet Take 20 mg by mouth 2 (two) times daily.     No current facility-administered medications for this visit.    Physical Exam:     BP 122/64   Pulse 68   Ht 5\' 5"  (1.651 m)    Wt 172 lb 6 oz (78.2 kg)   LMP 07/16/2023 (Exact Date)   SpO2 98%   BMI 28.68 kg/m   GENERAL:  Pleasant female in NAD PSYCH: : Cooperative, normal affect EENT:  conjunctiva pink, mucous membranes moist, neck supple without masses CARDIAC:  RRR, no murmur heard, no peripheral edema PULM: Normal respiratory effort, lungs CTA bilaterally, no wheezing ABDOMEN:  Nondistended, soft, nontender. No obvious masses, no hepatomegaly,  normal bowel sounds SKIN:  turgor, no lesions seen Musculoskeletal:  Normal muscle tone, normal strength NEURO: Alert and oriented x 3, no focal neurologic deficits   IMPRESSION and PLAN:    1) GERD 2) Dysphagia EGD in 09/2020 with normal-appearing esophagus, but biopsies showing reflux changes in the lower esophagus.  Reflux symptoms tend to be responsive to PPI in the past, but stopped taking due to family history of familial nephritis.  She otherwise has preserved renal function.  Discussed diagnostic and treatment options at length today, to include 1) EGD, 2) Esophageal Manometry and pH/impedance testing, 3) repeat trial of acid suppression therapy.  After careful consideration and shared decision making, she decided on the latter:  - Start Protonix  40 mg every morning - BMP check in 4 weeks - If not well-controlled on PPI, or intolerant to PPI, plan for EGD followed by esophageal manometry with pH/impedance testing is appropriate - Antireflux lifestyle/diet modifications with avoidance of exacerbating foods  3) Change in bowel habits 4) Constipation 5) Abdominal bloating - Start MiraLAX 1 cap daily - Adequate hydration with at least 64 ounces of water daily - Discussed role/utility of colonoscopy and offered colonoscopy to evaluate further for mucosal extraluminal pathology.  She would like to trial MiraLAX first and proceed with colonoscopy if no appreciable improvement  6) Hematochezia - Suspect anorectal etiology given close association with  constipation.  As above, offered colonoscopy, and she will trial medications first  7) Vitamin D  deficiency 8) B12 deficiency - Recheck vitamin D  and B12 today, along with checking iron and folate for potential Co-malabsorption - Check CBC   RTC in 3 months or sooner prn            Laquetta Plank Akila Batta ,DO, FACG 07/19/2023, 9:09 AM

## 2023-07-19 NOTE — Addendum Note (Signed)
 Addended by: Diantha Fossa on: 07/19/2023 03:44 PM   Modules accepted: Orders

## 2023-07-24 ENCOUNTER — Other Ambulatory Visit: Payer: Self-pay

## 2023-07-24 ENCOUNTER — Other Ambulatory Visit (HOSPITAL_COMMUNITY): Payer: Self-pay

## 2023-07-24 DIAGNOSIS — E538 Deficiency of other specified B group vitamins: Secondary | ICD-10-CM

## 2023-07-24 DIAGNOSIS — E559 Vitamin D deficiency, unspecified: Secondary | ICD-10-CM

## 2023-07-24 MED ORDER — VITAMIN D (ERGOCALCIFEROL) 1.25 MG (50000 UNIT) PO CAPS
50000.0000 [IU] | ORAL_CAPSULE | ORAL | 0 refills | Status: DC
  Filled 2023-07-24: qty 8, 56d supply, fill #0

## 2023-07-24 MED ORDER — CYANOCOBALAMIN 1000 MCG/ML IJ SOLN
1000.0000 ug | INTRAMUSCULAR | 0 refills | Status: DC
  Filled 2023-07-24: qty 4, 28d supply, fill #0

## 2023-07-25 ENCOUNTER — Other Ambulatory Visit (INDEPENDENT_AMBULATORY_CARE_PROVIDER_SITE_OTHER): Payer: Commercial Managed Care - PPO

## 2023-07-25 DIAGNOSIS — K921 Melena: Secondary | ICD-10-CM | POA: Diagnosis not present

## 2023-07-25 DIAGNOSIS — K219 Gastro-esophageal reflux disease without esophagitis: Secondary | ICD-10-CM

## 2023-07-25 DIAGNOSIS — K5900 Constipation, unspecified: Secondary | ICD-10-CM

## 2023-07-25 DIAGNOSIS — E559 Vitamin D deficiency, unspecified: Secondary | ICD-10-CM

## 2023-07-25 DIAGNOSIS — R194 Change in bowel habit: Secondary | ICD-10-CM | POA: Diagnosis not present

## 2023-07-25 DIAGNOSIS — R1310 Dysphagia, unspecified: Secondary | ICD-10-CM

## 2023-07-25 DIAGNOSIS — E538 Deficiency of other specified B group vitamins: Secondary | ICD-10-CM | POA: Diagnosis not present

## 2023-07-25 LAB — BASIC METABOLIC PANEL
BUN: 16 mg/dL (ref 6–23)
CO2: 28 meq/L (ref 19–32)
Calcium: 8.7 mg/dL (ref 8.4–10.5)
Chloride: 104 meq/L (ref 96–112)
Creatinine, Ser: 0.64 mg/dL (ref 0.40–1.20)
GFR: 112.94 mL/min (ref >60.00)
Glucose, Bld: 90 mg/dL (ref 70–99)
Potassium: 4.1 meq/L (ref 3.5–5.1)
Sodium: 139 meq/L (ref 135–145)

## 2023-07-26 ENCOUNTER — Encounter: Payer: Self-pay | Admitting: Gastroenterology

## 2023-07-28 LAB — HOMOCYSTEINE: Homocysteine: 7.9 umol/L (ref <10.4)

## 2023-07-28 LAB — METHYLMALONIC ACID, SERUM: Methylmalonic Acid, Quant: 119 nmol/L (ref 55–335)

## 2023-08-09 ENCOUNTER — Other Ambulatory Visit (HOSPITAL_BASED_OUTPATIENT_CLINIC_OR_DEPARTMENT_OTHER): Payer: Self-pay

## 2023-08-09 ENCOUNTER — Other Ambulatory Visit: Payer: Self-pay

## 2023-08-09 MED ORDER — CYANOCOBALAMIN 1000 MCG/ML IJ SOLN
1000.0000 ug | INTRAMUSCULAR | 3 refills | Status: AC
  Filled 2023-08-09 – 2023-11-29 (×2): qty 3, 90d supply, fill #0
  Filled 2024-03-21: qty 3, 90d supply, fill #1

## 2023-08-22 ENCOUNTER — Encounter: Payer: Self-pay | Admitting: Gastroenterology

## 2023-09-21 IMAGING — US US BREAST*L* LIMITED INC AXILLA
1 series · 4 of 4 positions shown · non-contrast
Comparison: None.

CLINICAL DATA: 35-year-old female presenting for intermittent focal
pain in the upper outer left breast for several months. No strong
family history of breast cancer.

EXAM:
DIGITAL DIAGNOSTIC BILATERAL MAMMOGRAM WITH TOMOSYNTHESIS AND CAD;
ULTRASOUND LEFT BREAST LIMITED
TECHNIQUE: Bilateral digital diagnostic mammography and breast tomosynthesis
was performed. The images were evaluated with computer-aided
detection.; Targeted ultrasound examination of the left breast was
performed.

[Series 1: us breast*left* limited inc axilla · 0.06mm/px · 4 of 4 slices shown]
[im 1/4]
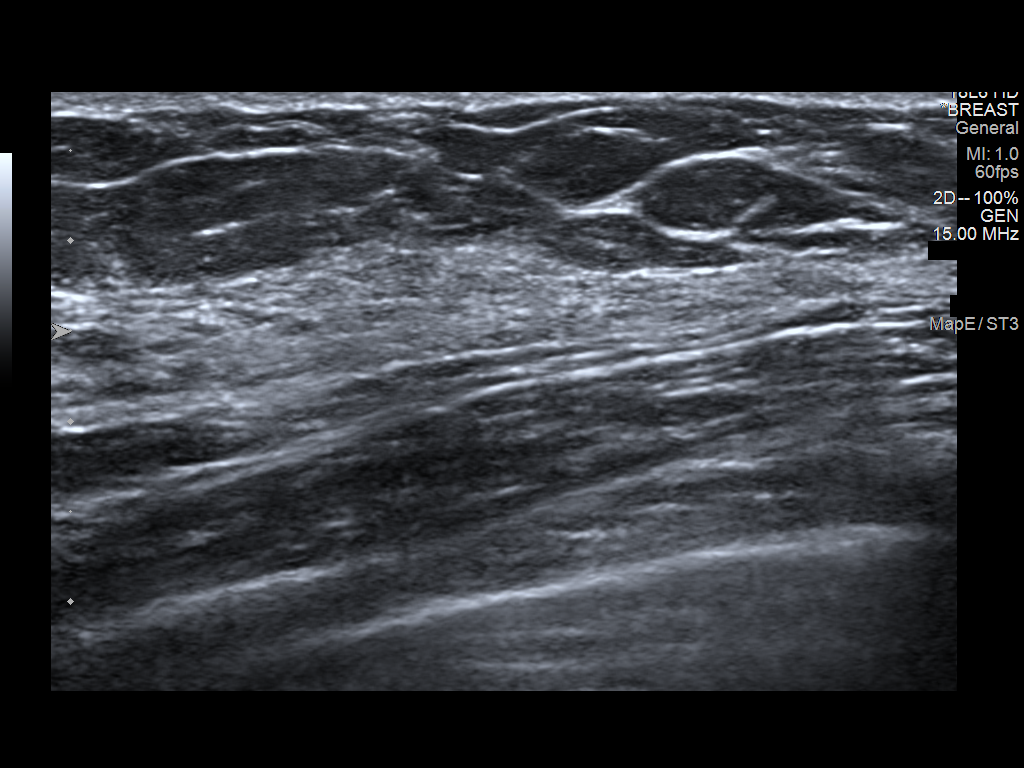
[im 2/4]
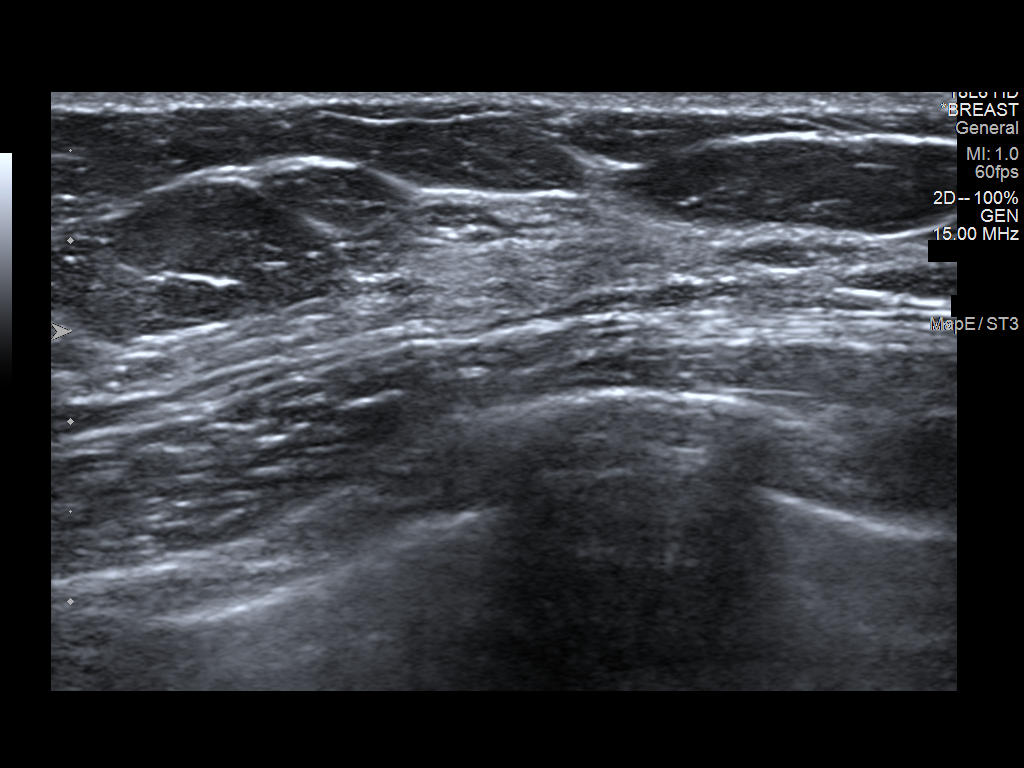
[im 3/4]
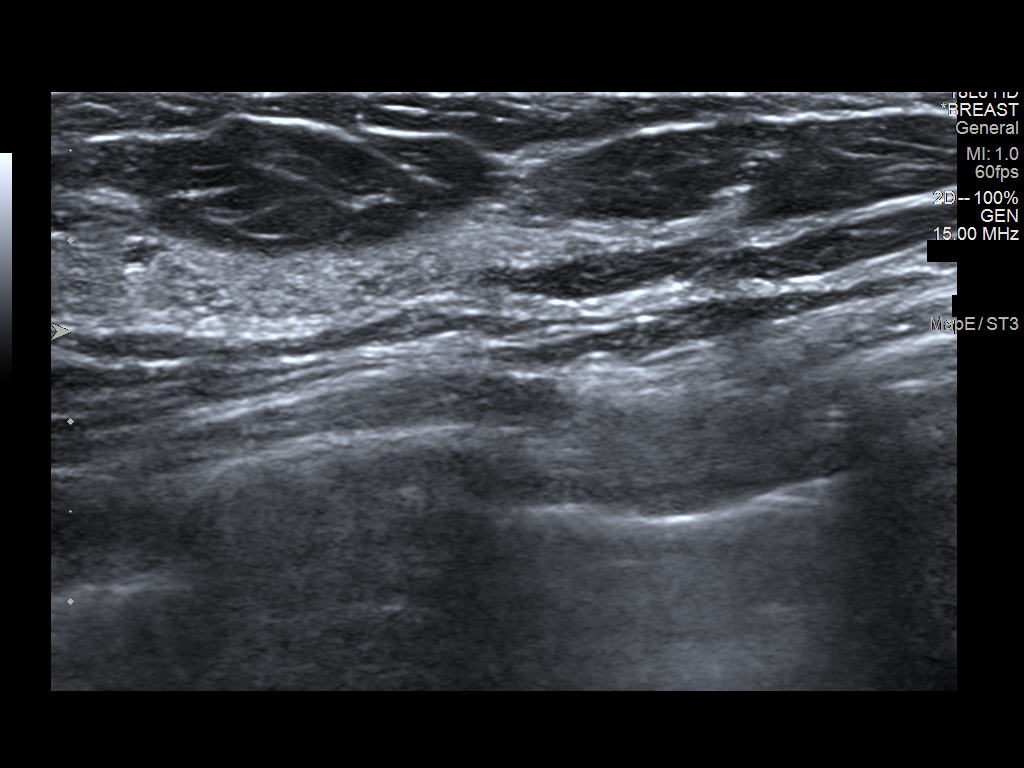
[im 4/4]
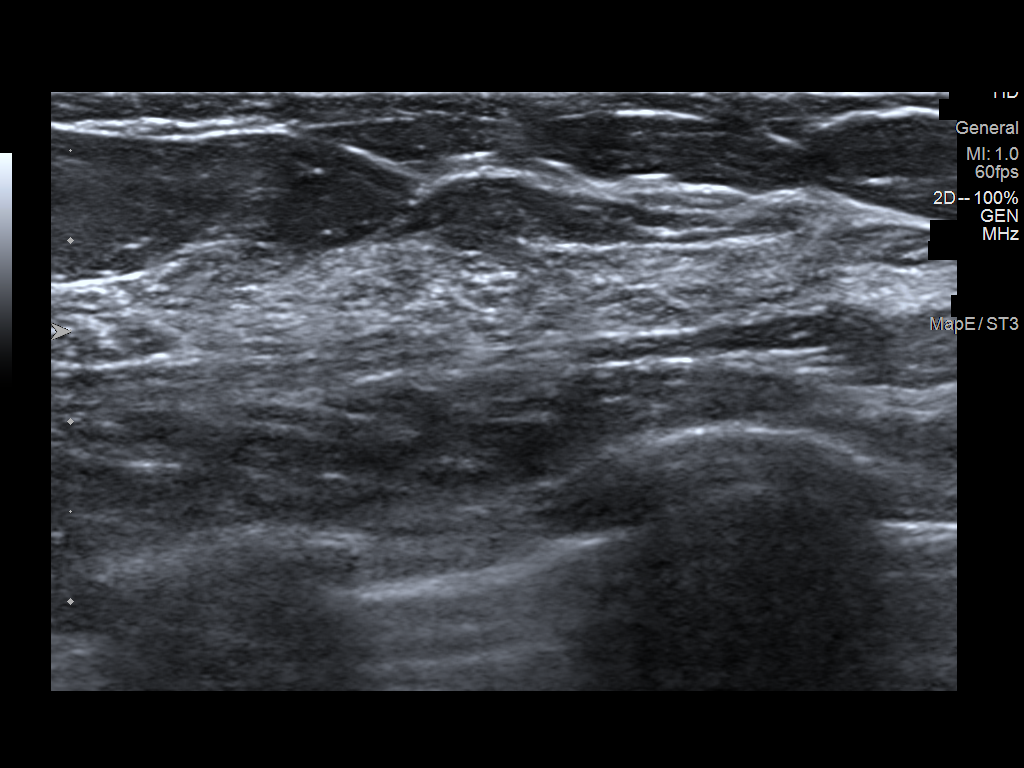

[4 of 4 positions shown; findings below may reference images not displayed]

ACR Breast Density Category c: The breast tissue is heterogeneously
dense, which may obscure small masses.
FINDINGS: Mammogram:

Right breast: No suspicious mass, distortion, or microcalcifications
are identified to suggest presence of malignancy.

Left breast: A skin BB marks the site of focal pain reported by the
patient in the upper outer left breast. A spot tangential view of
this area was performed in addition to standard views. No suspicious
mass, distortion, or microcalcifications are identified to suggest
presence of malignancy.

On physical exam at the site of pain reported by the patient in the
upper-outer left breast I do not feel a discrete mass or focal area
of thickening.

Ultrasound:

Targeted ultrasound is performed at the site of pain reported by the
patient in the left breast at 230-3 o'clock demonstrating no cystic
or solid mass.
IMPRESSION: 1. No mammographic or sonographic evidence of malignancy at the site
of pain reported by the patient in the left breast.

2.  No mammographic evidence of malignancy in the right breast.

RECOMMENDATION:
1. Clinical follow-up as needed for the left breast pain. Breast
pain is a common condition, which will often resolve on its own
without intervention. It can be affected by hormonal changes,
medication side effect, weight changes and fit of the bra. Pain may
also be referred from other adjacent areas of the body. Breast pain
may be improved by wearing adequate well-fitting support,
over-the-counter topical and oral NSAID medication, low-fat diet,
and ice/heat as needed. Studies have shown an improvement in cyclic
pain with use of evening primrose oil and vitamin E.

2.  Begin routine annual screening mammography at age 40.

I have discussed the findings and recommendations with the patient.
If applicable, a reminder letter will be sent to the patient
regarding the next appointment.

BI-RADS CATEGORY  1: Negative.

## 2023-09-21 IMAGING — MG DIGITAL DIAGNOSTIC BILAT W/ TOMO W/ CAD
6 of 10 series · 6 of 30 positions shown · non-contrast
Comparison: None.

CLINICAL DATA: 35-year-old female presenting for intermittent focal
pain in the upper outer left breast for several months. No strong
family history of breast cancer.

EXAM:
DIGITAL DIAGNOSTIC BILATERAL MAMMOGRAM WITH TOMOSYNTHESIS AND CAD;
ULTRASOUND LEFT BREAST LIMITED
TECHNIQUE: Bilateral digital diagnostic mammography and breast tomosynthesis
was performed. The images were evaluated with computer-aided
detection.; Targeted ultrasound examination of the left breast was
performed.

[L CC synth-2D]
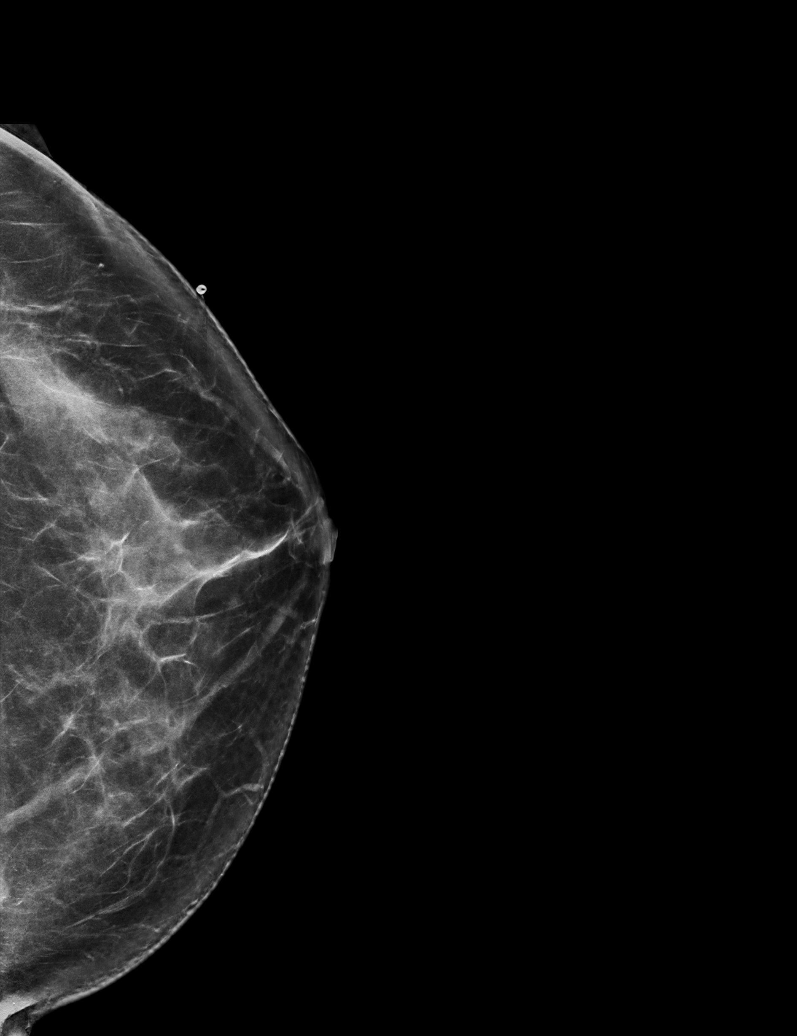

[L TAN synth-2D]
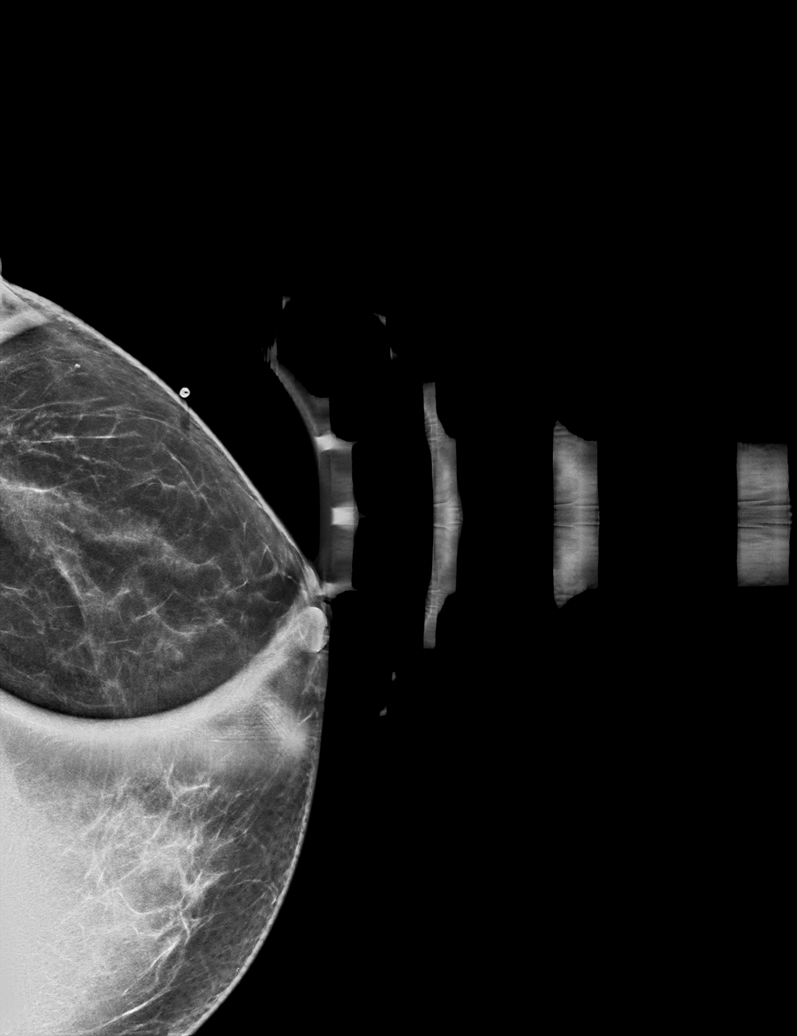

[L MLO synth-2D]
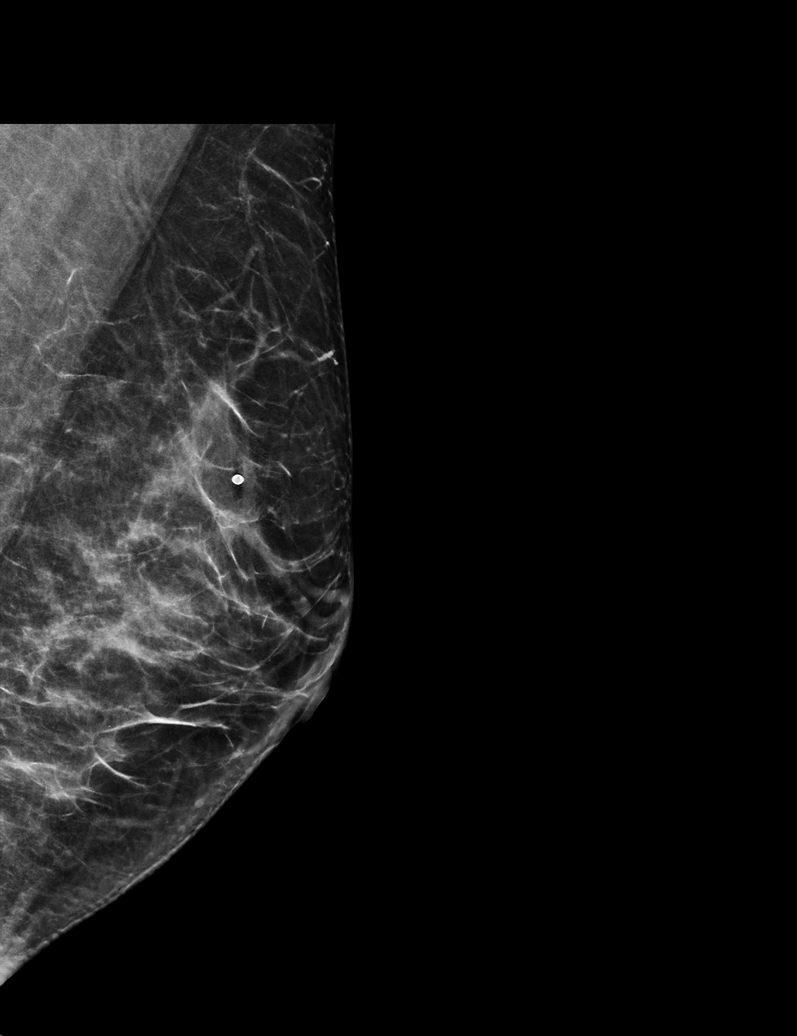

[R MLO synth-2D]
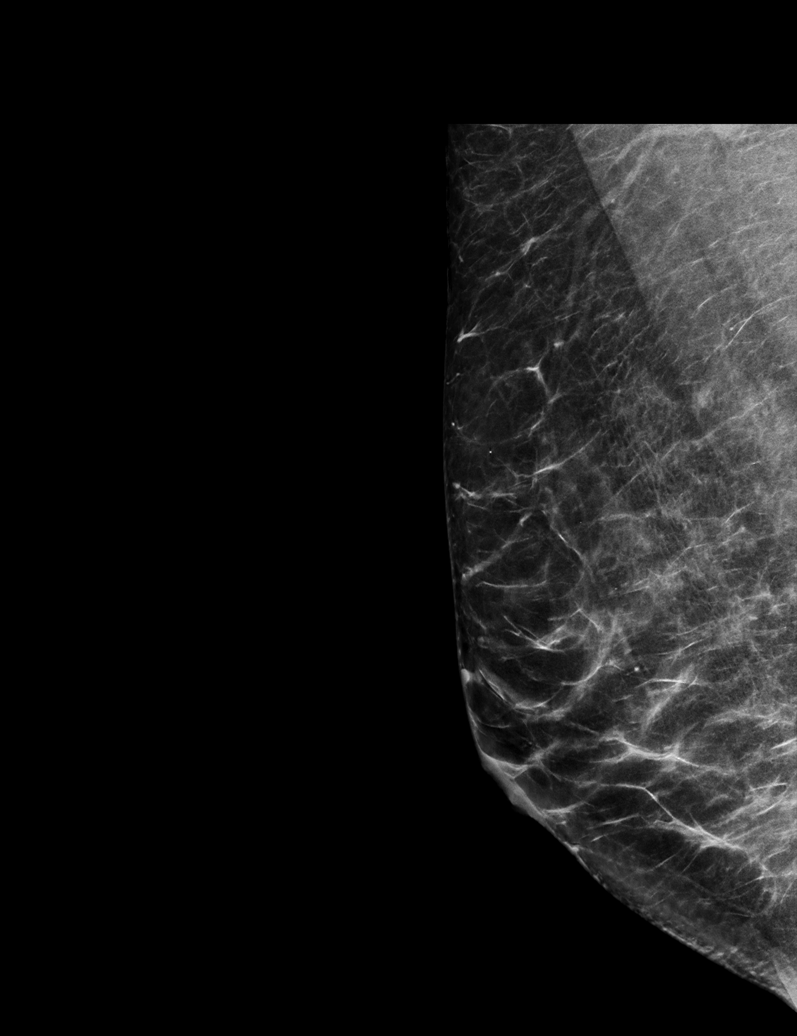

[R CC synth-2D]
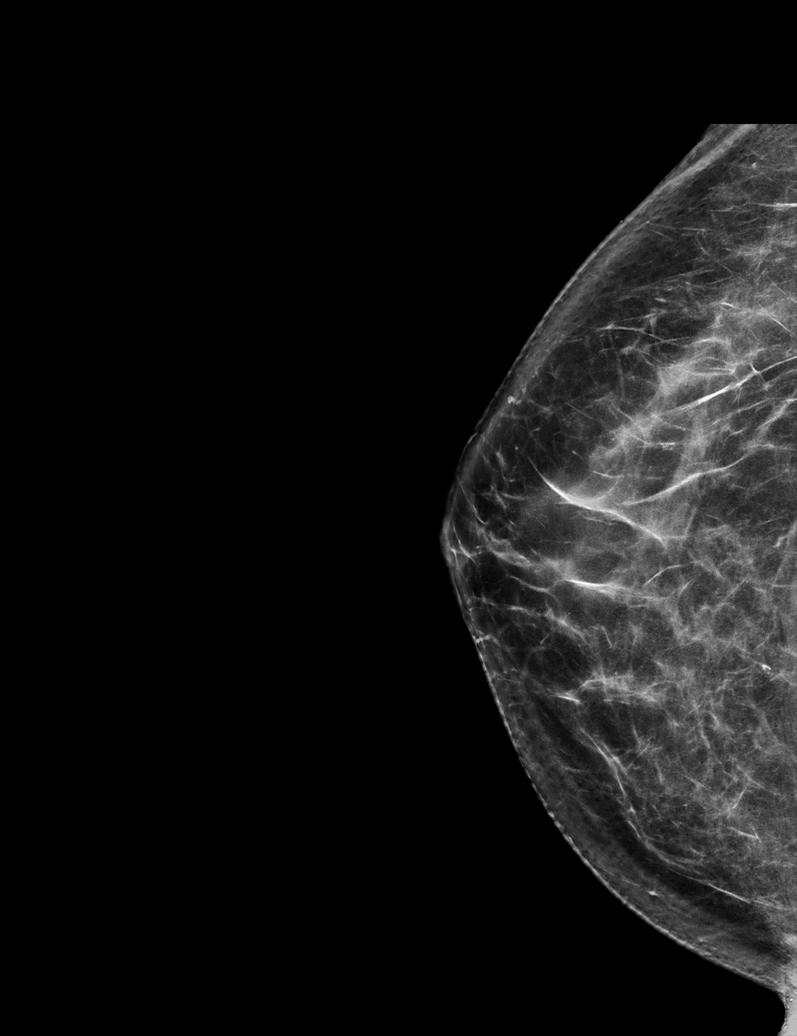

[L CC tomo · tomo slice 37/73.0]
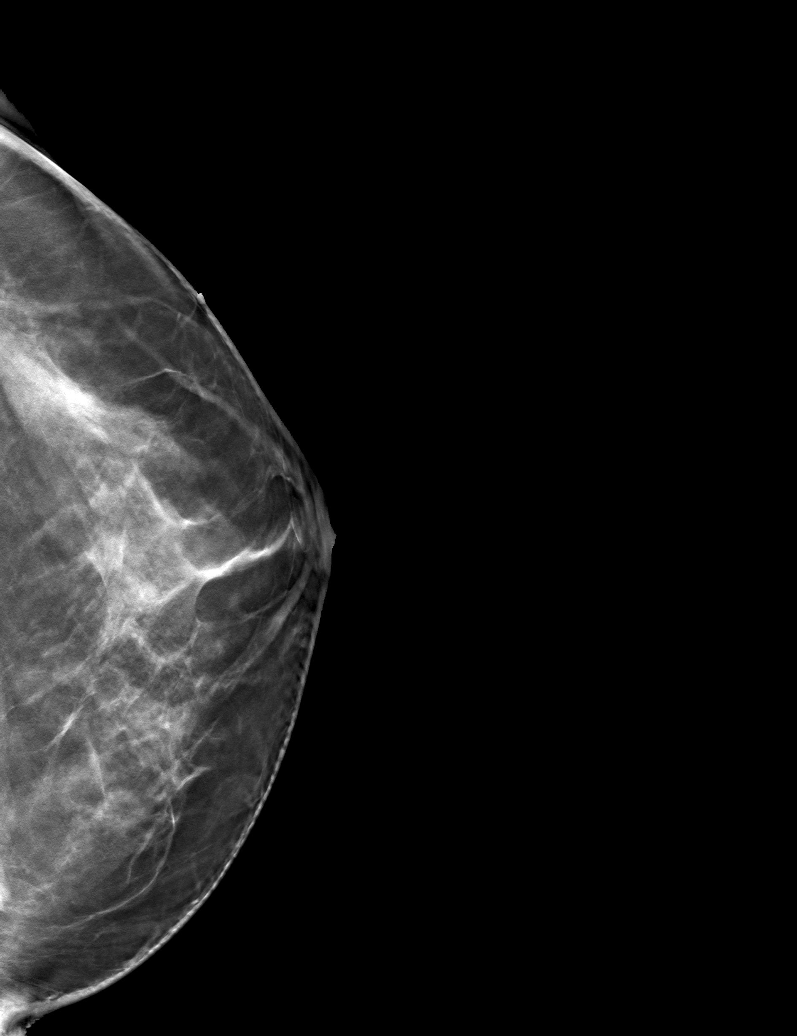

[6 of 30 positions shown; findings below may reference images not displayed]

ACR Breast Density Category c: The breast tissue is heterogeneously
dense, which may obscure small masses.
FINDINGS: Mammogram:

Right breast: No suspicious mass, distortion, or microcalcifications
are identified to suggest presence of malignancy.

Left breast: A skin BB marks the site of focal pain reported by the
patient in the upper outer left breast. A spot tangential view of
this area was performed in addition to standard views. No suspicious
mass, distortion, or microcalcifications are identified to suggest
presence of malignancy.

On physical exam at the site of pain reported by the patient in the
upper-outer left breast I do not feel a discrete mass or focal area
of thickening.

Ultrasound:

Targeted ultrasound is performed at the site of pain reported by the
patient in the left breast at 230-3 o'clock demonstrating no cystic
or solid mass.
IMPRESSION: 1. No mammographic or sonographic evidence of malignancy at the site
of pain reported by the patient in the left breast.

2.  No mammographic evidence of malignancy in the right breast.

RECOMMENDATION:
1. Clinical follow-up as needed for the left breast pain. Breast
pain is a common condition, which will often resolve on its own
without intervention. It can be affected by hormonal changes,
medication side effect, weight changes and fit of the bra. Pain may
also be referred from other adjacent areas of the body. Breast pain
may be improved by wearing adequate well-fitting support,
over-the-counter topical and oral NSAID medication, low-fat diet,
and ice/heat as needed. Studies have shown an improvement in cyclic
pain with use of evening primrose oil and vitamin E.

2.  Begin routine annual screening mammography at age 40.

I have discussed the findings and recommendations with the patient.
If applicable, a reminder letter will be sent to the patient
regarding the next appointment.

BI-RADS CATEGORY  1: Negative.

## 2023-09-27 ENCOUNTER — Other Ambulatory Visit (HOSPITAL_COMMUNITY): Payer: Self-pay

## 2023-09-27 MED ORDER — RIFAXIMIN 550 MG PO TABS
550.0000 mg | ORAL_TABLET | Freq: Three times a day (TID) | ORAL | 1 refills | Status: DC
  Filled 2023-09-27: qty 42, 14d supply, fill #0

## 2023-09-27 NOTE — Addendum Note (Signed)
 Addended by: Marisa Sprinkles on: 09/27/2023 01:41 PM   Modules accepted: Orders

## 2023-09-27 NOTE — Addendum Note (Signed)
 Addended by: Marisa Sprinkles on: 09/27/2023 02:25 PM   Modules accepted: Orders

## 2023-09-27 NOTE — Telephone Encounter (Signed)
 PA needed for Rifaximin 550 TID for 14 days. Thanks

## 2023-09-28 ENCOUNTER — Other Ambulatory Visit (HOSPITAL_COMMUNITY): Payer: Self-pay

## 2023-09-28 ENCOUNTER — Telehealth: Payer: Self-pay

## 2023-09-28 NOTE — Telephone Encounter (Signed)
 PA request has been Submitted. New Encounter has been or will be created for follow up. For additional info see Pharmacy Prior Auth telephone encounter from 09-28-2023.

## 2023-09-28 NOTE — Telephone Encounter (Signed)
 Pharmacy Patient Advocate Encounter   Received notification from Patient Advice Request messages that prior authorization for Xifaxan 550MG  tablets is required/requested.   Insurance verification completed.   The patient is insured through [facility] .   Per test claim: PA required; PA submitted to above mentioned insurance via CoverMyMeds Key/confirmation #/EOC [id number] Status is pending

## 2023-10-02 NOTE — Telephone Encounter (Signed)
 Pharmacy Patient Advocate Encounter  Received notification from [facility] that Prior Authorization for Xifaxan 550MG  tablets has been APPROVED from 10-01-2023 to 10-31-2023   PA #/Case ID/Reference #: [id number]

## 2023-10-09 ENCOUNTER — Other Ambulatory Visit: Payer: Self-pay

## 2023-10-09 ENCOUNTER — Other Ambulatory Visit (HOSPITAL_COMMUNITY): Payer: Self-pay

## 2023-10-09 DIAGNOSIS — E538 Deficiency of other specified B group vitamins: Secondary | ICD-10-CM

## 2023-10-09 DIAGNOSIS — E559 Vitamin D deficiency, unspecified: Secondary | ICD-10-CM

## 2023-10-09 MED ORDER — RIFAXIMIN 550 MG PO TABS
550.0000 mg | ORAL_TABLET | Freq: Three times a day (TID) | ORAL | 1 refills | Status: AC
  Filled 2023-10-09: qty 42, 14d supply, fill #0

## 2023-11-10 ENCOUNTER — Other Ambulatory Visit (INDEPENDENT_AMBULATORY_CARE_PROVIDER_SITE_OTHER)

## 2023-11-10 ENCOUNTER — Encounter: Payer: Self-pay | Admitting: Family

## 2023-11-10 ENCOUNTER — Other Ambulatory Visit (HOSPITAL_COMMUNITY): Payer: Self-pay

## 2023-11-10 ENCOUNTER — Telehealth (INDEPENDENT_AMBULATORY_CARE_PROVIDER_SITE_OTHER): Admitting: Family

## 2023-11-10 DIAGNOSIS — E538 Deficiency of other specified B group vitamins: Secondary | ICD-10-CM

## 2023-11-10 DIAGNOSIS — E559 Vitamin D deficiency, unspecified: Secondary | ICD-10-CM | POA: Diagnosis not present

## 2023-11-10 DIAGNOSIS — F418 Other specified anxiety disorders: Secondary | ICD-10-CM

## 2023-11-10 LAB — VITAMIN D 25 HYDROXY (VIT D DEFICIENCY, FRACTURES): VITD: 23.73 ng/mL — ABNORMAL LOW (ref 30.00–100.00)

## 2023-11-10 LAB — VITAMIN B12: Vitamin B-12: 320 pg/mL (ref 211–911)

## 2023-11-10 MED ORDER — HYDROXYZINE HCL 10 MG PO TABS
10.0000 mg | ORAL_TABLET | Freq: Three times a day (TID) | ORAL | 0 refills | Status: AC | PRN
  Filled 2023-11-10: qty 30, 10d supply, fill #0

## 2023-11-10 MED ORDER — HYDROXYZINE PAMOATE 50 MG PO CAPS
100.0000 mg | ORAL_CAPSULE | Freq: Three times a day (TID) | ORAL | 0 refills | Status: DC | PRN
  Filled 2023-11-10: qty 60, 10d supply, fill #0

## 2023-11-10 NOTE — Progress Notes (Signed)
 Misty Austin is a 38 y.o. female with the following history as recorded in EpicCare:  Patient Active Problem List   Diagnosis Date Noted   Migraine without aura and with status migrainosus, not intractable 02/20/2018   Indication for care in labor and delivery, antepartum 05/12/2015   NSVD (normal spontaneous vaginal delivery) 01/29/2013   ABDOMINAL PAIN OTHER SPECIFIED SITE 07/31/2009    Current Outpatient Medications  Medication Sig Dispense Refill   cyanocobalamin  (VITAMIN B12) 1000 MCG/ML injection Inject 1 mL (1,000 mcg total) into the muscle every 30 (thirty) days. 3 mL 3   DULoxetine  (CYMBALTA ) 30 MG capsule Take 1 capsule (30 mg total) by mouth daily. 90 capsule 1   famotidine  (PEPCID ) 20 MG tablet Take 20 mg by mouth 2 (two) times daily.     hydrOXYzine (ATARAX) 10 MG tablet Take 1 tablet (10 mg total) by mouth 3 (three) times daily as needed. 30 tablet 0   pantoprazole  (PROTONIX ) 40 MG tablet Take 1 tablet (40 mg total) by mouth every morning. 30 tablet 6   Vitamin D , Ergocalciferol , (DRISDOL ) 1.25 MG (50000 UNIT) CAPS capsule Take 1 capsule (50,000 Units total) by mouth every 7 (seven) days. 8 capsule 0   No current facility-administered medications for this visit.    Allergies: Patient has no known allergies.  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   Hx of varicella    IBS (irritable bowel syndrome)    Thyroid  nodule    benign folliculr on bx 09/2013   Vaginal Pap smear, abnormal     Past Surgical History:  Procedure Laterality Date   LAPAROSCOPIC CHOLECYSTECTOMY  09/2009   [facility]   WISDOM TOOTH EXTRACTION      Family History  Problem Relation Age of Onset   Esophageal cancer Maternal Grandfather    Diabetes Maternal Grandmother    Breast cancer Maternal Grandmother     Lymphoma Paternal Grandfather    Leukemia Paternal Grandfather    Diabetes Paternal Grandmother    Kidney disease Mother        familial nephritis   Melanoma Mother 4   Basal cell carcinoma Mother    Colon cancer Neg Hx    Stomach cancer Neg Hx    Rectal cancer Neg Hx     Social History   Tobacco Use   Smoking status: Never   Smokeless tobacco: Never  Substance Use Topics   Alcohol use: No    Subjective:    I connected with Misty Austin on 11/10/23 at  2:00 PM EDT by a video enabled telemedicine application and verified that I am speaking with the correct person using two identifiers.   I discussed the limitations of evaluation and management by telemedicine and the availability of in person appointments. The patient expressed understanding and agreed to proceed.  Provider in office/ patient is at home; provider and patient are only 2 people on video call.   Patient notes she is having increased stress/ anxiety and is concerned it will worsen over the next few months preparing for site visit at the hospital; felt like she got home last night and essentially had a panic attack; would like to discuss medication options;   Objective:  There were no vitals filed for this visit.  General: Well developed, well nourished,  in no acute distress  Skin : Warm and dry.  Head: Normocephalic and atraumatic  Lungs: Respirations unlabored;  Neurologic: Alert and oriented; speech intact; face symmetrical;   Assessment:  1. Situational anxiety     Plan:  Can try increasing Cymbalta  to 30 mg bid for at least the next few months until work project is completed; Rx for Atarax 10 mg tid prn for breakthrough anxiety symptoms; she will call back with response to medication change and follow up to be determined.   No follow-ups on file.  No orders of the defined types were placed in this encounter.   Requested Prescriptions   Signed Prescriptions Disp Refills   hydrOXYzine (ATARAX)  10 MG tablet 30 tablet 0    Sig: Take 1 tablet (10 mg total) by mouth 3 (three) times daily as needed.

## 2023-11-13 ENCOUNTER — Other Ambulatory Visit: Payer: Self-pay

## 2023-11-13 ENCOUNTER — Other Ambulatory Visit (HOSPITAL_BASED_OUTPATIENT_CLINIC_OR_DEPARTMENT_OTHER): Payer: Self-pay

## 2023-11-13 DIAGNOSIS — E559 Vitamin D deficiency, unspecified: Secondary | ICD-10-CM

## 2023-11-13 MED ORDER — VITAMIN D (ERGOCALCIFEROL) 1.25 MG (50000 UNIT) PO CAPS
50000.0000 [IU] | ORAL_CAPSULE | ORAL | 0 refills | Status: DC
  Filled 2023-11-13 – 2024-01-23 (×3): qty 8, 56d supply, fill #0

## 2023-11-21 ENCOUNTER — Other Ambulatory Visit (HOSPITAL_COMMUNITY): Payer: Self-pay

## 2023-11-23 ENCOUNTER — Other Ambulatory Visit (HOSPITAL_BASED_OUTPATIENT_CLINIC_OR_DEPARTMENT_OTHER): Payer: Self-pay

## 2023-11-29 ENCOUNTER — Other Ambulatory Visit: Payer: Self-pay | Admitting: Family

## 2023-11-30 ENCOUNTER — Other Ambulatory Visit (HOSPITAL_COMMUNITY): Payer: Self-pay

## 2023-11-30 ENCOUNTER — Other Ambulatory Visit: Payer: Self-pay

## 2023-11-30 MED ORDER — DULOXETINE HCL 30 MG PO CPEP
30.0000 mg | ORAL_CAPSULE | Freq: Every day | ORAL | 1 refills | Status: DC
  Filled 2023-11-30: qty 90, 90d supply, fill #0
  Filled 2024-02-01: qty 90, 90d supply, fill #1

## 2024-01-16 ENCOUNTER — Other Ambulatory Visit (HOSPITAL_BASED_OUTPATIENT_CLINIC_OR_DEPARTMENT_OTHER): Payer: Self-pay

## 2024-01-23 ENCOUNTER — Other Ambulatory Visit (HOSPITAL_COMMUNITY): Payer: Self-pay

## 2024-01-23 ENCOUNTER — Other Ambulatory Visit (HOSPITAL_BASED_OUTPATIENT_CLINIC_OR_DEPARTMENT_OTHER): Payer: Self-pay

## 2024-02-01 ENCOUNTER — Other Ambulatory Visit (HOSPITAL_COMMUNITY): Payer: Self-pay

## 2024-02-01 ENCOUNTER — Other Ambulatory Visit: Payer: Self-pay

## 2024-02-01 ENCOUNTER — Telehealth: Admitting: Physician Assistant

## 2024-02-01 DIAGNOSIS — J069 Acute upper respiratory infection, unspecified: Secondary | ICD-10-CM | POA: Diagnosis not present

## 2024-02-01 DIAGNOSIS — B9689 Other specified bacterial agents as the cause of diseases classified elsewhere: Secondary | ICD-10-CM

## 2024-02-01 MED ORDER — BENZONATATE 100 MG PO CAPS
100.0000 mg | ORAL_CAPSULE | Freq: Three times a day (TID) | ORAL | 0 refills | Status: AC | PRN
  Filled 2024-02-01: qty 30, 10d supply, fill #0

## 2024-02-01 MED ORDER — ALBUTEROL SULFATE HFA 108 (90 BASE) MCG/ACT IN AERS
2.0000 | INHALATION_SPRAY | Freq: Four times a day (QID) | RESPIRATORY_TRACT | 0 refills | Status: AC | PRN
  Filled 2024-02-01: qty 6.7, 25d supply, fill #0

## 2024-02-01 MED ORDER — AZITHROMYCIN 250 MG PO TABS
ORAL_TABLET | ORAL | 0 refills | Status: AC
  Filled 2024-02-01: qty 6, 5d supply, fill #0

## 2024-02-01 NOTE — Progress Notes (Signed)
 I have spent 5 minutes in review of e-visit questionnaire, review and updating patient chart, medical decision making and response to patient.   Piedad Climes, PA-C

## 2024-02-01 NOTE — Progress Notes (Signed)
 Message sent to patient requesting further input regarding current symptoms. Awaiting patient response.

## 2024-02-01 NOTE — Progress Notes (Signed)
 E-Visit for Cough   We are sorry that you are not feeling well.  Here is how we plan to help!  Based on your presentation I believe you most likely have A cough due to bacteria.  When patients have a fever and a productive cough with a change in color or increased sputum production, we are concerned about bacterial bronchitis.  If left untreated it can progress to pneumonia.  If your symptoms do not improve with your treatment plan it is important that you contact your provider.   I have prescribed Azithromyin 250 mg: two tablets now and then one tablet daily for 4 additonal days    In addition you may use A prescription cough medication called Tessalon  Perles 100mg . You may take 1-2 capsules every 8 hours as needed for your cough. I have also sent in an albuterol  inhaler to take as directed.  From your responses in the eVisit questionnaire you describe inflammation in the upper respiratory tract which is causing a significant cough.  This is commonly called Bronchitis and has four common causes:   Allergies Viral Infections Acid Reflux Bacterial Infection Allergies, viruses and acid reflux are treated by controlling symptoms or eliminating the cause. An example might be a cough caused by taking certain blood pressure medications. You stop the cough by changing the medication. Another example might be a cough caused by acid reflux. Controlling the reflux helps control the cough.  USE OF BRONCHODILATOR (RESCUE) INHALERS: There is a risk from using your bronchodilator too frequently.  The risk is that over-reliance on a medication which only relaxes the muscles surrounding the breathing tubes can reduce the effectiveness of medications prescribed to reduce swelling and congestion of the tubes themselves.  Although you feel brief relief from the bronchodilator inhaler, your asthma may actually be worsening with the tubes becoming more swollen and filled with mucus.  This can delay other crucial  treatments, such as oral steroid medications. If you need to use a bronchodilator inhaler daily, several times per day, you should discuss this with your provider.  There are probably better treatments that could be used to keep your asthma under control.     HOME CARE Only take medications as instructed by your medical team. Complete the entire course of an antibiotic. Drink plenty of fluids and get plenty of rest. Avoid close contacts especially the very young and the elderly Cover your mouth if you cough or cough into your sleeve. Always remember to wash your hands A steam or ultrasonic humidifier can help congestion.   GET HELP RIGHT AWAY IF: You develop worsening fever. You become short of breath You cough up blood. Your symptoms persist after you have completed your treatment plan MAKE SURE YOU  Understand these instructions. Will watch your condition. Will get help right away if you are not doing well or get worse.    Thank you for choosing an e-visit.  Your e-visit answers were reviewed by a board certified advanced clinical practitioner to complete your personal care plan. Depending upon the condition, your plan could have included both over the counter or prescription medications.  Please review your pharmacy choice. Make sure the pharmacy is open so you can pick up prescription now. If there is a problem, you may contact your provider through [employer] and have the prescription routed to another pharmacy.  Your safety is important to us . If you have drug allergies check your prescription carefully.   For the next 24 hours you  can use MyChart to ask questions about today's visit, request a non-urgent call back, or ask for a work or school excuse. You will get an email in the next two days asking about your experience. I hope that your e-visit has been valuable and will speed your recovery.

## 2024-02-02 ENCOUNTER — Encounter: Payer: Self-pay | Admitting: Family

## 2024-02-02 ENCOUNTER — Other Ambulatory Visit (HOSPITAL_COMMUNITY): Payer: Self-pay

## 2024-02-05 ENCOUNTER — Other Ambulatory Visit: Payer: Self-pay | Admitting: Family

## 2024-02-05 ENCOUNTER — Other Ambulatory Visit (HOSPITAL_BASED_OUTPATIENT_CLINIC_OR_DEPARTMENT_OTHER): Payer: Self-pay

## 2024-02-05 MED ORDER — DULOXETINE HCL 30 MG PO CPEP
30.0000 mg | ORAL_CAPSULE | Freq: Two times a day (BID) | ORAL | 1 refills | Status: AC
  Filled 2024-02-05 – 2024-02-07 (×2): qty 180, 90d supply, fill #0
  Filled 2024-06-11: qty 180, 90d supply, fill #1
  Filled ????-??-??: fill #0

## 2024-02-06 ENCOUNTER — Encounter (HOSPITAL_BASED_OUTPATIENT_CLINIC_OR_DEPARTMENT_OTHER): Payer: Self-pay

## 2024-02-07 ENCOUNTER — Other Ambulatory Visit (HOSPITAL_BASED_OUTPATIENT_CLINIC_OR_DEPARTMENT_OTHER): Payer: Self-pay

## 2024-02-07 ENCOUNTER — Other Ambulatory Visit (HOSPITAL_COMMUNITY): Payer: Self-pay

## 2024-03-01 DIAGNOSIS — M76812 Anterior tibial syndrome, left leg: Secondary | ICD-10-CM | POA: Diagnosis not present

## 2024-03-01 DIAGNOSIS — M79672 Pain in left foot: Secondary | ICD-10-CM | POA: Diagnosis not present

## 2024-03-21 ENCOUNTER — Other Ambulatory Visit (HOSPITAL_COMMUNITY): Payer: Self-pay

## 2024-03-21 ENCOUNTER — Ambulatory Visit: Admitting: Internal Medicine

## 2024-03-21 ENCOUNTER — Encounter: Payer: Self-pay | Admitting: Internal Medicine

## 2024-03-21 VITALS — BP 110/72 | HR 78 | Ht 65.0 in | Wt 175.0 lb

## 2024-03-21 DIAGNOSIS — E538 Deficiency of other specified B group vitamins: Secondary | ICD-10-CM | POA: Diagnosis not present

## 2024-03-21 DIAGNOSIS — R5383 Other fatigue: Secondary | ICD-10-CM

## 2024-03-21 DIAGNOSIS — E559 Vitamin D deficiency, unspecified: Secondary | ICD-10-CM | POA: Insufficient documentation

## 2024-03-21 MED ORDER — VITAMIN D (ERGOCALCIFEROL) 1.25 MG (50000 UNIT) PO CAPS
50000.0000 [IU] | ORAL_CAPSULE | ORAL | 1 refills | Status: AC
  Filled 2024-03-21: qty 12, 84d supply, fill #0
  Filled 2024-06-11: qty 12, 84d supply, fill #1

## 2024-03-21 NOTE — Patient Instructions (Signed)
 Please start multivitamin daily Please take ergocalciferol  50,000 international units weekly

## 2024-03-21 NOTE — Progress Notes (Unsigned)
 Name: Misty Austin  MRN/ DOB: [id number], Aug 27, 1985    Age/ Sex: 38 y.o., female    PCP: Jason Leita Repine, FNP   Reason for Endocrinology Evaluation: Vitamin D  deficiency     Date of Initial Endocrinology Evaluation: 03/21/2024     HPI: Misty Austin is a 38 y.o. female with a past medical history of vitamin D  deficiency, situational anxiety and vitamin B12 deficiency. The patient presented for initial endocrinology clinic visit on 03/21/2024 for consultative assistance with her vitamin D  deficiency.   Patient has been noted with vitamin D  deficiency in 2019 at 16.32 NG/mL.  Of note the patient did have a normal vitamin D  in 2022 at 41.03 NG/mL.  Patient has been evaluated by GI for GERD, rectal bleed, dysphagia, and constipation   Patient does have a history of cholecystectomy but no intestinal/stomach surgeries She is on PPI and antacids Celiac antibody screening and ANA have been normal  No neck sx - hx hx of thyroid  nodule  in 2015 , s/p benign FNA Perioral tingling- no Has chronic constipation - on colace  Has tingling of the feet  Has joint pain of the hands, fingers  and knees  No falls  Has chronic hair loss  Has noted increase headaches, insomnia Worsening anxiety  Has fatigue, memory issues, inability to lose the weight despite changing her diet and walking every day Has tried Wegovy  couple years ago but developed severe abdominal pain and vomiting LMP this week, monthly  No calcium or MVI  Has been consistent for ergocalciferol  recently , ran out last week , didn't;t take it this Monday    Mother with chronic familial nephritis Maternal grandfather with esophageal cancer Mother with Barrett's disease   Ergocalciferol  50,000 units weekly Has 2 kids 80 and 8 yr      HISTORY:  Past Medical History:  Past Medical History:  Diagnosis Date   Anxiety and depression    Congenital kidney disease    familial nephritis   GERD  (gastroesophageal reflux disease)    History of colposcopy with cervical biopsy 08/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 She has never used smokeless tobacco. She reports that she does not drink alcohol and does not use drugs. Family History: family history includes Basal cell carcinoma in her mother; Breast cancer in her maternal grandmother; Diabetes in her maternal grandmother and paternal grandmother; Esophageal cancer in her maternal grandfather; Kidney disease in her mother; Leukemia in her paternal grandfather; Lymphoma in her paternal grandfather; Melanoma (age of onset: 20) in her mother.   HOME MEDICATIONS: Allergies as of 03/21/2024   No Known Allergies      Medication List        Accurate as of March 21, 2024  7:16 AM. If you have any questions, ask your nurse or doctor.          albuterol  108 (90 Base) MCG/ACT inhaler Commonly known as: VENTOLIN  HFA Inhale 2 puffs into the lungs every 6 (six) hours as needed for wheezing or shortness of breath.   benzonatate  100 MG  capsule Commonly known as: TESSALON  Take 1 capsule (100 mg total) by mouth 3 (three) times daily as needed for cough.   cyanocobalamin  1000 MCG/ML injection Commonly known as: VITAMIN B12 Inject 1 mL (1,000 mcg total) into the muscle every 30 (thirty) days.   DULoxetine  30 MG capsule Commonly known as: CYMBALTA  Take 1 capsule (30 mg total) by mouth 2 (two) times daily.   famotidine  20 MG tablet Commonly known as: PEPCID  Take 20 mg by mouth 2 (two) times daily.   hydrOXYzine  10 MG tablet Commonly known as: ATARAX  Take 1 tablet (10 mg total) by mouth 3 (three) times daily as  needed.   pantoprazole  40 MG tablet Commonly known as: Protonix  Take 1 tablet (40 mg total) by mouth every morning.   Vitamin D  (Ergocalciferol ) 1.25 MG (50000 UNIT) Caps capsule Commonly known as: DRISDOL  Take 1 capsule (50,000 Units total) by mouth every 7 (seven) days.          REVIEW OF SYSTEMS: A comprehensive ROS was conducted with the patient and is negative except as per HPI and below:  ROS     OBJECTIVE:  VS: There were no vitals taken for this visit.   Wt Readings from Last 3 Encounters:  07/19/23 172 lb 6 oz (78.2 kg)  05/22/23 167 lb (75.8 kg)  04/25/23 165 lb (74.8 kg)     EXAM: General: Pt appears well and is in NAD  Eyes: External eye exam normal without stare, lid lag or exophthalmos.  EOM intact.  PERRL.  Neck: General: Supple without adenopathy. Thyroid : Thyroid  size normal.  No goiter or nodules appreciated. No thyroid  bruit.  Lungs: Clear with good BS bilat   Heart: Auscultation: RRR.  Abdomen: Soft, nontender  Extremities:  BL LE: No pretibial edema   Mental Status: Judgment, insight: Intact Orientation: Oriented to time, place, and person Mood and affect: No depression, anxiety, or agitation     DATA REVIEWED: ***    ASSESSMENT/PLAN/RECOMMENDATIONS:   Vitamin D  deficiency    Medications :  2.  Vitamin B12 deficiency    3.  Fatigue   4.  Inability to lose weight:   -  Signed electronically by: Stefano Redgie Butts, MD  [facility] Endocrinology  [facility] [address] [place], [place] [address] Phone: [phone] FAX: [phone]   CC: Jason Leita Repine, FNP [address] [place] [address] Phone: [phone] Fax: [phone]   Return to Endocrinology clinic as below: Future Appointments  Date Time Provider Department Center  03/21/2024  8:50 AM Darletta Noblett, Donell Redgie, MD [id number] None

## 2024-03-22 ENCOUNTER — Ambulatory Visit: Payer: Self-pay | Admitting: Internal Medicine

## 2024-03-22 LAB — VITAMIN B12: Vitamin B-12: 409 pg/mL (ref 200–1100)

## 2024-03-22 LAB — PTH, INTACT AND CALCIUM
Calcium: 9 mg/dL (ref 8.6–10.2)
PTH: 56 pg/mL (ref 16–77)

## 2024-03-22 LAB — VITAMIN D 25 HYDROXY (VIT D DEFICIENCY, FRACTURES): Vit D, 25-Hydroxy: 61 ng/mL (ref 30–100)

## 2024-03-22 LAB — HEPATIC FUNCTION PANEL
AG Ratio: 2.1 (calc) (ref 1.0–2.5)
ALT: 12 U/L (ref 6–29)
AST: 15 U/L (ref 10–30)
Albumin: 4.2 g/dL (ref 3.6–5.1)
Alkaline phosphatase (APISO): 69 U/L (ref 31–125)
Bilirubin, Direct: 0 mg/dL (ref 0.0–0.2)
Globulin: 2 g/dL (ref 1.9–3.7)
Indirect Bilirubin: 0.3 mg/dL (ref 0.2–1.2)
Total Bilirubin: 0.3 mg/dL (ref 0.2–1.2)
Total Protein: 6.2 g/dL (ref 6.1–8.1)

## 2024-03-22 LAB — CBC
HCT: 35.9 % (ref 35.0–45.0)
Hemoglobin: 11.8 g/dL (ref 11.7–15.5)
MCH: 30.2 pg (ref 27.0–33.0)
MCHC: 32.9 g/dL (ref 32.0–36.0)
MCV: 91.8 fL (ref 80.0–100.0)
MPV: 9.6 fL (ref 7.5–12.5)
Platelets: 281 Thousand/uL (ref 140–400)
RBC: 3.91 Million/uL (ref 3.80–5.10)
RDW: 11.9 % (ref 11.0–15.0)
WBC: 4.2 Thousand/uL (ref 3.8–10.8)

## 2024-03-22 LAB — TSH: TSH: 1.08 m[IU]/L

## 2024-03-22 LAB — T4, FREE: Free T4: 1.1 ng/dL (ref 0.8–1.8)

## 2024-03-22 LAB — PHOSPHORUS: Phosphorus: 3.3 mg/dL (ref 2.5–4.5)

## 2024-05-17 DIAGNOSIS — Z124 Encounter for screening for malignant neoplasm of cervix: Secondary | ICD-10-CM | POA: Diagnosis not present

## 2024-05-17 DIAGNOSIS — Z1389 Encounter for screening for other disorder: Secondary | ICD-10-CM | POA: Diagnosis not present

## 2024-05-17 DIAGNOSIS — Z01419 Encounter for gynecological examination (general) (routine) without abnormal findings: Secondary | ICD-10-CM | POA: Diagnosis not present

## 2024-05-17 DIAGNOSIS — Z130 Encounter for screening for diseases of the blood and blood-forming organs and certain disorders involving the immune mechanism: Secondary | ICD-10-CM | POA: Diagnosis not present

## 2024-05-17 DIAGNOSIS — N816 Rectocele: Secondary | ICD-10-CM | POA: Diagnosis not present

## 2024-05-21 ENCOUNTER — Other Ambulatory Visit

## 2024-05-21 DIAGNOSIS — E559 Vitamin D deficiency, unspecified: Secondary | ICD-10-CM | POA: Diagnosis not present

## 2024-05-21 LAB — VITAMIN D 25 HYDROXY (VIT D DEFICIENCY, FRACTURES): Vit D, 25-Hydroxy: 58 ng/mL (ref 30–100)

## 2024-07-22 ENCOUNTER — Ambulatory Visit: Admitting: Internal Medicine
